# Patient Record
Sex: Female | Born: 1995 | Hispanic: No | Marital: Single | Smoking: Never smoker
Health system: Southern US, Community
[De-identification: ages and names within clinical notes are randomized; demographics above are authoritative.]

## PROBLEM LIST (undated history)

## (undated) ENCOUNTER — Emergency Department (HOSPITAL_COMMUNITY): Admission: EM | Discharge status: Absent without leave | Payer: Managed Care, Other (non HMO)

## (undated) DIAGNOSIS — E282 Polycystic ovarian syndrome: Secondary | ICD-10-CM

## (undated) HISTORY — PX: WISDOM TOOTH EXTRACTION: SHX21

## (undated) HISTORY — DX: Polycystic ovarian syndrome: E28.2

---

## 2016-07-13 ENCOUNTER — Ambulatory Visit: Payer: Self-pay | Admitting: Family

## 2016-08-03 ENCOUNTER — Telehealth: Payer: Self-pay | Admitting: Behavioral Health

## 2016-08-03 NOTE — Telephone Encounter (Signed)
Unable to reach patient at time of Pre-Visit Call.  Left message for patient to return call when available.    

## 2016-08-04 ENCOUNTER — Ambulatory Visit: Payer: Self-pay | Admitting: Family

## 2016-08-04 DIAGNOSIS — Z0289 Encounter for other administrative examinations: Secondary | ICD-10-CM

## 2017-04-18 ENCOUNTER — Encounter: Payer: Self-pay | Admitting: Emergency Medicine

## 2017-04-18 ENCOUNTER — Emergency Department (HOSPITAL_COMMUNITY): Payer: Managed Care, Other (non HMO)

## 2017-04-18 ENCOUNTER — Observation Stay (HOSPITAL_COMMUNITY)
Admission: EM | Admit: 2017-04-18 | Discharge: 2017-04-20 | Disposition: A | Discharge status: Home / Self Care | Payer: Managed Care, Other (non HMO) | Attending: General Surgery | Admitting: General Surgery

## 2017-04-18 DIAGNOSIS — K3580 Unspecified acute appendicitis: Principal | ICD-10-CM | POA: Insufficient documentation

## 2017-04-18 DIAGNOSIS — R1031 Right lower quadrant pain: Secondary | ICD-10-CM | POA: Diagnosis present

## 2017-04-18 LAB — COMPREHENSIVE METABOLIC PANEL
ALK PHOS: 48 U/L (ref 38–126)
ALT: 25 U/L (ref 14–54)
AST: 20 U/L (ref 15–41)
Albumin: 3.6 g/dL (ref 3.5–5.0)
Anion gap: 7 (ref 5–15)
BUN: 7 mg/dL (ref 6–20)
CALCIUM: 8.8 mg/dL — AB (ref 8.9–10.3)
CHLORIDE: 105 mmol/L (ref 101–111)
CO2: 24 mmol/L (ref 22–32)
CREATININE: 0.57 mg/dL (ref 0.44–1.00)
Glucose, Bld: 81 mg/dL (ref 65–99)
Potassium: 3.7 mmol/L (ref 3.5–5.1)
Sodium: 136 mmol/L (ref 135–145)
Total Bilirubin: 0.4 mg/dL (ref 0.3–1.2)
Total Protein: 7.1 g/dL (ref 6.5–8.1)

## 2017-04-18 LAB — CBC
HCT: 37.1 % (ref 36.0–46.0)
Hemoglobin: 12.3 g/dL (ref 12.0–15.0)
MCH: 28 pg (ref 26.0–34.0)
MCHC: 33.2 g/dL (ref 30.0–36.0)
MCV: 84.3 fL (ref 78.0–100.0)
PLATELETS: 348 10*3/uL (ref 150–400)
RBC: 4.4 MIL/uL (ref 3.87–5.11)
RDW: 13 % (ref 11.5–15.5)
WBC: 8.2 10*3/uL (ref 4.0–10.5)

## 2017-04-18 LAB — URINALYSIS, ROUTINE W REFLEX MICROSCOPIC
BILIRUBIN URINE: NEGATIVE
Glucose, UA: NEGATIVE mg/dL
Hgb urine dipstick: NEGATIVE
KETONES UR: NEGATIVE mg/dL
Leukocytes, UA: NEGATIVE
Nitrite: NEGATIVE
PH: 6 (ref 5.0–8.0)
Protein, ur: NEGATIVE mg/dL
SPECIFIC GRAVITY, URINE: 1.025 (ref 1.005–1.030)

## 2017-04-18 LAB — I-STAT BETA HCG BLOOD, ED (MC, WL, AP ONLY)

## 2017-04-18 LAB — LIPASE, BLOOD: LIPASE: 20 U/L (ref 11–51)

## 2017-04-18 MED ORDER — LACTATED RINGERS IV SOLN
INTRAVENOUS | Status: DC
  Administered 2017-04-19 (×3): via INTRAVENOUS

## 2017-04-18 MED ORDER — ONDANSETRON HCL 4 MG/2ML IJ SOLN
4.0000 mg | Freq: Four times a day (QID) | INTRAMUSCULAR | Status: DC | PRN
  Administered 2017-04-19 (×2): 4 mg via INTRAVENOUS
  Filled 2017-04-18: qty 2

## 2017-04-18 MED ORDER — ENOXAPARIN SODIUM 40 MG/0.4ML ~~LOC~~ SOLN
40.0000 mg | Freq: Every day | SUBCUTANEOUS | Status: DC
  Administered 2017-04-19: 40 mg via SUBCUTANEOUS
  Filled 2017-04-18: qty 0.4

## 2017-04-18 MED ORDER — IOPAMIDOL (ISOVUE-300) INJECTION 61%
INTRAVENOUS | Status: AC
  Administered 2017-04-18: 100 mL
  Filled 2017-04-18: qty 100

## 2017-04-18 MED ORDER — NORGESTIMATE-ETH ESTRADIOL 0.25-35 MG-MCG PO TABS
1.0000 | ORAL_TABLET | Freq: Every day | ORAL | Status: DC
  Administered 2017-04-19: 1 via ORAL

## 2017-04-18 MED ORDER — ONDANSETRON 4 MG PO TBDP: 4.0000 mg | ORAL_TABLET | Freq: Four times a day (QID) | ORAL | Status: DC | PRN

## 2017-04-18 MED ORDER — MORPHINE SULFATE (PF) 2 MG/ML IV SOLN: 2.0000 mg | INTRAVENOUS | Status: DC | PRN

## 2017-04-18 NOTE — ED Provider Notes (Signed)
[facility] DEPT Provider Note   CSN: [id number] Arrival date & time: 04/18/17  1451     History   Chief Complaint Chief Complaint  Patient presents with  . Abdominal Pain    HPI Misty Oneill is a 21 y.o. female.chief complaint is abdominal pain  HPI :  This is a 21 year old female with abdominal pain. She waking this morning not feeling well and a poor appetite. She has some nonspecific upper abdominal pain that is now migrated to her right lower quadrant. She is no blanching bleeding or discharge. She is on OCPs. Her last period was over 2 weeks ago. Nausea but no vomiting. No fevers or chills. No vaginal discharge or STD concerns.  History reviewed. No pertinent past medical history.  Patient Active Problem List   Diagnosis Date Noted  . Right lower quadrant abdominal pain 04/18/2017    History reviewed. No pertinent surgical history.  OB History    No data available       Home Medications    Prior to Admission medications   Medication Sig Start Date End Date Taking? Authorizing Provider  norgestimate-ethinyl estradiol (ORTHO-CYCLEN,SPRINTEC,PREVIFEM) 0.25-35 MG-MCG tablet Take 1 tablet by mouth daily.   Yes [provider]    Family History History reviewed. No pertinent family history.  Social History Social History  Substance Use Topics  . Smoking status: Never Smoker  . Smokeless tobacco: Never Used  . Alcohol use No     Allergies   Patient has no known allergies.   Review of Systems Review of Systems  Constitutional: Positive for appetite change. Negative for chills, diaphoresis, fatigue and fever.  HENT: Negative for mouth sores, sore throat and trouble swallowing.   Eyes: Negative for visual disturbance.  Respiratory: Negative for cough, chest tightness, shortness of breath and wheezing.   Cardiovascular: Negative for chest pain.  Gastrointestinal: Positive for abdominal pain and nausea. Negative for abdominal  distention, diarrhea and vomiting.  Endocrine: Negative for polydipsia, polyphagia and polyuria.  Genitourinary: Negative for dysuria, frequency and hematuria.  Musculoskeletal: Negative for gait problem.  Skin: Negative for color change, pallor and rash.  Neurological: Negative for dizziness, syncope, light-headedness and headaches.  Hematological: Does not bruise/bleed easily.  Psychiatric/Behavioral: Negative for behavioral problems and confusion.     Physical Exam Updated Vital Signs BP 132/66 (BP Location: Left Arm)   Pulse 61   Temp 98.4 F (36.9 C) (Oral)   Resp 18   Ht 5\' 7"  (1.702 m)   Wt 102.9 kg (226 lb 13.7 oz)   LMP 04/04/2017 (Approximate)   SpO2 100%   BMI 35.53 kg/m   Physical Exam  Constitutional: She is oriented to person, place, and time. She appears well-developed and well-nourished. No distress.  HENT:  Head: Normocephalic.  Eyes: Pupils are equal, round, and reactive to light. Conjunctivae are normal. No scleral icterus.  Neck: Normal range of motion. Neck supple. No thyromegaly present.  Cardiovascular: Normal rate and regular rhythm.  Exam reveals no gallop and no friction rub.   No murmur heard. Pulmonary/Chest: Effort normal and breath sounds normal. No respiratory distress. She has no wheezes. She has no rales.  Abdominal: Soft. Bowel sounds are normal. She exhibits no distension. There is no tenderness. There is no rebound.  Abdomen is soft. No pain with leg flexion or extension. No pain with cough but tenderness in the right lower quadrant without frank rebound tenderness. No right upper quadrant pain no suprapubic pain  Musculoskeletal: Normal range of  motion.  Neurological: She is alert and oriented to person, place, and time.  Skin: Skin is warm and dry. No rash noted.  Psychiatric: She has a normal mood and affect. Her behavior is normal.     ED Treatments / Results  Labs (all labs ordered are listed, but only abnormal results are  displayed) Labs Reviewed  COMPREHENSIVE METABOLIC PANEL - Abnormal; Notable for the following:       Result Value   Calcium 8.8 (*)    All other components within normal limits  URINALYSIS, ROUTINE W REFLEX MICROSCOPIC - Abnormal; Notable for the following:    Bacteria, UA RARE (*)    Squamous Epithelial / LPF 6-30 (*)    All other components within normal limits  URINE CULTURE  LIPASE, BLOOD  CBC  HIV ANTIBODY (ROUTINE TESTING)  CBC  I-STAT BETA HCG BLOOD, ED ([facility], [facility], [facility] ONLY)    EKG  EKG Interpretation None       Radiology Ct Abdomen Pelvis W Contrast  Result Date: 04/18/2017 CLINICAL DATA:  Abdominal pain with nausea EXAM: CT ABDOMEN AND PELVIS WITH CONTRAST TECHNIQUE: Multidetector CT imaging of the abdomen and pelvis was performed using the standard protocol following bolus administration of intravenous contrast. CONTRAST:  135mL ISOVUE-300 IOPAMIDOL (ISOVUE-300) INJECTION 61% COMPARISON:  None. FINDINGS: Lower chest: No acute abnormality. Hepatobiliary: No focal liver abnormality is seen. No gallstones, gallbladder wall thickening, or biliary dilatation. Pancreas: Unremarkable. No pancreatic ductal dilatation or surrounding inflammatory changes. Spleen: Normal in size without focal abnormality. Adrenals/Urinary Tract: Adrenal glands are unremarkable. Kidneys are normal, without renal calculi, focal lesion, or hydronephrosis. Bladder is unremarkable. Stomach/Bowel: Stomach is within normal limits. Proximal appendix is of normal caliber. Distal appendix and tip borderline to slightly enlarged, measuring up to 8 mm, but not much surrounding inflammation. No evidence of bowel wall thickening, distention, or inflammatory changes. Vascular/Lymphatic: Non aneurysmal aorta. No significantly enlarged lymph nodes Reproductive: Uterus and bilateral adnexa are unremarkable. Other: Small free fluid in the pelvis. Negative for free air. Small fat in the umbilicus. Musculoskeletal: No acute or  significant osseous findings. IMPRESSION: 1. Borderline enlarged distal appendix an appendix tip uptake 8 mm but not much surrounding inflammation, clinical correlation recommended. 2. Small amount of free fluid in the pelvis. There are no other significant abnormalities seen. Electronically Signed   By: Donavan Foil M.D.   On: 04/18/2017 20:00    Procedures Procedures (including critical care time)  Medications Ordered in ED Medications  norgestimate-ethinyl estradiol (ORTHO-CYCLEN,SPRINTEC,PREVIFEM) 0.25-35 MG-MCG tablet 1 tablet (not administered)  enoxaparin (LOVENOX) injection 40 mg (not administered)  lactated ringers infusion (not administered)  morphine 2 MG/ML injection 2-4 mg (not administered)  ondansetron (ZOFRAN-ODT) disintegrating tablet 4 mg (not administered)    Or  ondansetron (ZOFRAN) injection 4 mg (not administered)  iopamidol (ISOVUE-300) 61 % injection (100 mLs  Contrast Given 04/18/17 1917)     Initial Impression / Assessment and Plan / ED Course  I have reviewed the triage vital signs and the nursing notes.  Pertinent labs & imaging results that were available during my care of the patient were reviewed by me and considered in my medical decision making (see chart for details).    Patient's exam is equivocal. Unfortunately so is her CT scan which shows thickening of the distal appendix with no periappendiceal inflammation. She has a leukocytosis. I discussed the case with Dr. Excell Seltzer. Dr. Excell Seltzer immediately return my call and agrees to examine the patient emergency room. He is  in agreement that her exam is suggestive but not definitive. He we'll plan admission for serial exams and appendectomy as indicated.  Final Clinical Impressions(s) / ED Diagnoses   Final diagnoses:  Right lower quadrant abdominal pain    New Prescriptions Current Discharge Medication List       Tanna Furry, MD 04/18/17 2330

## 2017-04-18 NOTE — H&P (Signed)
Misty Oneill is an 21 y.o. female.    Chief Complaint: Abdominal pain  HPI: patient is a 21 year old female who this morning, now about 12 hours ago developed the gradual onset of generalized crampy like abdominal pain. She went to work and while bear the pain moved to the right lower quadrant. The pain gradually became worse where she felt like she had to leave work and went to an urgent care center. She was referred to the emergency department for further evaluation. She describes fairly constant right lower quadrant pain with occasional stabbing. It waxes and wanes. Worse with motion. She has had some lack of appetite and mild nausea without vomiting. Denies fever or chills. No urinary symptoms. No history of any previous similar problems or chronic GI complaints. No vaginal discharge or abnormal bleeding.  History reviewed. No pertinent past medical history.  History reviewed. No pertinent surgical history.  History reviewed. No pertinent family history. Social History:  reports that she has never smoked. She has never used smokeless tobacco. She reports that she does not drink alcohol or use drugs.  Allergies: No Known Allergies  No current facility-administered medications for this encounter.    Current Outpatient Prescriptions  Medication Sig Dispense Refill  . norgestimate-ethinyl estradiol (ORTHO-CYCLEN,SPRINTEC,PREVIFEM) 0.25-35 MG-MCG tablet Take 1 tablet by mouth daily.        Results for orders placed or performed during the hospital encounter of 04/18/17 (from the past 48 hour(s))  Lipase, blood     Status: None   Collection Time: 04/18/17  5:12 PM  Result Value Ref Range   Lipase 20 11 - 51 U/L  Comprehensive metabolic panel     Status: Abnormal   Collection Time: 04/18/17  5:12 PM  Result Value Ref Range   Sodium 136 135 - 145 mmol/L   Potassium 3.7 3.5 - 5.1 mmol/L   Chloride 105 101 - 111 mmol/L   CO2 24 22 - 32 mmol/L   Glucose, Bld 81 65 - 99 mg/dL    BUN 7 6 - 20 mg/dL   Creatinine, Ser 0.57 0.44 - 1.00 mg/dL   Calcium 8.8 (L) 8.9 - 10.3 mg/dL   Total Protein 7.1 6.5 - 8.1 g/dL   Albumin 3.6 3.5 - 5.0 g/dL   AST 20 15 - 41 U/L   ALT 25 14 - 54 U/L   Alkaline Phosphatase 48 38 - 126 U/L   Total Bilirubin 0.4 0.3 - 1.2 mg/dL   GFR calc non Af Amer >60 >60 mL/min   GFR calc Af Amer >60 >60 mL/min    Comment: (NOTE) The eGFR has been calculated using the CKD EPI equation. This calculation has not been validated in all clinical situations. eGFR's persistently <60 mL/min signify possible Chronic Kidney Disease.    Anion gap 7 5 - 15  CBC     Status: None   Collection Time: 04/18/17  5:12 PM  Result Value Ref Range   WBC 8.2 4.0 - 10.5 K/uL   RBC 4.40 3.87 - 5.11 MIL/uL   Hemoglobin 12.3 12.0 - 15.0 g/dL   HCT 37.1 36.0 - 46.0 %   MCV 84.3 78.0 - 100.0 fL   MCH 28.0 26.0 - 34.0 pg   MCHC 33.2 30.0 - 36.0 g/dL   RDW 13.0 11.5 - 15.5 %   Platelets 348 150 - 400 K/uL  I-Stat beta hCG blood, ED     Status: None   Collection Time: 04/18/17  5:20 PM  Result Value Ref  Range   I-stat hCG, quantitative <5.0 <5 mIU/mL   Comment 3            Comment:   GEST. AGE      CONC.  (mIU/mL)   <=1 WEEK        5 - 50     2 WEEKS       50 - 500     3 WEEKS       100 - 10,000     4 WEEKS     1,000 - 30,000        FEMALE AND NON-PREGNANT FEMALE:     LESS THAN 5 mIU/mL   Urinalysis, Routine w reflex microscopic     Status: Abnormal   Collection Time: 04/18/17  5:34 PM  Result Value Ref Range   Color, Urine YELLOW YELLOW   APPearance CLEAR CLEAR   Specific Gravity, Urine 1.025 1.005 - 1.030   pH 6.0 5.0 - 8.0   Glucose, UA NEGATIVE NEGATIVE mg/dL   Hgb urine dipstick NEGATIVE NEGATIVE   Bilirubin Urine NEGATIVE NEGATIVE   Ketones, ur NEGATIVE NEGATIVE mg/dL   Protein, ur NEGATIVE NEGATIVE mg/dL   Nitrite NEGATIVE NEGATIVE   Leukocytes, UA NEGATIVE NEGATIVE   RBC / HPF 0-5 0 - 5 RBC/hpf   WBC, UA 6-30 0 - 5 WBC/hpf   Bacteria, UA RARE  (A) NONE SEEN   Squamous Epithelial / LPF 6-30 (A) NONE SEEN   Mucus PRESENT    Ct Abdomen Pelvis W Contrast  Result Date: 04/18/2017 CLINICAL DATA:  Abdominal pain with nausea EXAM: CT ABDOMEN AND PELVIS WITH CONTRAST TECHNIQUE: Multidetector CT imaging of the abdomen and pelvis was performed using the standard protocol following bolus administration of intravenous contrast. CONTRAST:  175m ISOVUE-300 IOPAMIDOL (ISOVUE-300) INJECTION 61% COMPARISON:  None. FINDINGS: Lower chest: No acute abnormality. Hepatobiliary: No focal liver abnormality is seen. No gallstones, gallbladder wall thickening, or biliary dilatation. Pancreas: Unremarkable. No pancreatic ductal dilatation or surrounding inflammatory changes. Spleen: Normal in size without focal abnormality. Adrenals/Urinary Tract: Adrenal glands are unremarkable. Kidneys are normal, without renal calculi, focal lesion, or hydronephrosis. Bladder is unremarkable. Stomach/Bowel: Stomach is within normal limits. Proximal appendix is of normal caliber. Distal appendix and tip borderline to slightly enlarged, measuring up to 8 mm, but not much surrounding inflammation. No evidence of bowel wall thickening, distention, or inflammatory changes. Vascular/Lymphatic: Non aneurysmal aorta. No significantly enlarged lymph nodes Reproductive: Uterus and bilateral adnexa are unremarkable. Other: Small free fluid in the pelvis. Negative for free air. Small fat in the umbilicus. Musculoskeletal: No acute or significant osseous findings. IMPRESSION: 1. Borderline enlarged distal appendix an appendix tip uptake 8 mm but not much surrounding inflammation, clinical correlation recommended. 2. Small amount of free fluid in the pelvis. There are no other significant abnormalities seen. Electronically Signed   By: KDonavan FoilM.D.   On: 04/18/2017 20:00    Review of Systems  Constitutional: Negative for chills and fever.  Respiratory: Negative.   Cardiovascular: Negative.    Gastrointestinal: Positive for abdominal pain and nausea. Negative for blood in stool, constipation, diarrhea and vomiting.  Genitourinary: Negative.   Psychiatric/Behavioral: Suicidal ideas: .cmed.    Blood pressure 138/82, pulse 65, temperature 98 F (36.7 C), temperature source Oral, resp. rate 14, last menstrual period 04/04/2017, SpO2 100 %. Physical Exam  General: Alert, moderately obese Hispanic female, in no distress Skin: Warm and dry without rash or infection. HEENT: No palpable masses or thyromegaly. Sclera nonicteric. Pupils  equal round and reactive. Lymph nodes: No cervical, supraclavicular, nodes palpable. Lungs: Breath sounds clear and equal without increased work of breathing Cardiovascular: Regular rate and rhythm without murmur. No JVD or edema. Peripheral pulses intact. Abdomen: Nondistended. There is localized mild right lower quadrant tenderness without guarding or peritoneal signs. No masses palpable. No organomegaly. No palpable hernias. Extremities: No edema or joint swelling or deformity. No chronic venous stasis changes. Neurologic: Alert and fully oriented. Affect normal. No gross motor deficits.  Assessment/Plan Generally healthy 21 year old female with abdominal pain and nausea, history highly suggestive of appendicitis. She is only mildly tender. No leukocytosis and CT scan is equivocal. Possible early appendicitis. I recommended overnight observation and repeat CBC in the morning. If her pain and tenderness persists she likely will require appendectomy. 6-30 WBC noted in UA but nitrite negative. No symptoms. We will obtain urine C&S.  Edward Jolly, MD 04/18/2017, 9:41 PM

## 2017-04-18 NOTE — ED Notes (Signed)
ED Provider at bedside. 

## 2017-04-18 NOTE — ED Triage Notes (Signed)
Pt c/o abdominal pain and nausea since this morning. Pt states pain began from RUQ to LUQ then moved to RLQ later in the morning.

## 2017-04-18 NOTE — Progress Notes (Signed)
Patient arrived on unit.  Ambulated from stretcher to bed independently. 22 L hand- SL. Informed patient of possible OR tomorrow and orders to be NPO after midnight.  Patient verbalized understanding.

## 2017-04-18 NOTE — ED Notes (Addendum)
LMP: week of aug 6

## 2017-04-18 NOTE — ED Notes (Signed)
Surgery at bedside.

## 2017-04-18 NOTE — ED Notes (Signed)
Patient transported to CT 

## 2017-04-19 ENCOUNTER — Encounter (HOSPITAL_COMMUNITY)
Admission: EM | Disposition: A | Discharge status: Home / Self Care | Payer: Self-pay | Source: Home / Self Care | Attending: Emergency Medicine

## 2017-04-19 ENCOUNTER — Observation Stay (HOSPITAL_COMMUNITY): Payer: Managed Care, Other (non HMO) | Admitting: Anesthesiology

## 2017-04-19 ENCOUNTER — Encounter (HOSPITAL_COMMUNITY): Payer: Self-pay

## 2017-04-19 DIAGNOSIS — K3580 Unspecified acute appendicitis: Secondary | ICD-10-CM | POA: Diagnosis not present

## 2017-04-19 HISTORY — PX: LAPAROSCOPIC APPENDECTOMY: SHX408

## 2017-04-19 LAB — CBC
HEMATOCRIT: 34.1 % — AB (ref 36.0–46.0)
Hemoglobin: 11.3 g/dL — ABNORMAL LOW (ref 12.0–15.0)
MCH: 27.9 pg (ref 26.0–34.0)
MCHC: 33.1 g/dL (ref 30.0–36.0)
MCV: 84.2 fL (ref 78.0–100.0)
PLATELETS: 299 10*3/uL (ref 150–400)
RBC: 4.05 MIL/uL (ref 3.87–5.11)
RDW: 13 % (ref 11.5–15.5)
WBC: 7 10*3/uL (ref 4.0–10.5)

## 2017-04-19 LAB — SURGICAL PCR SCREEN
MRSA, PCR: NEGATIVE
STAPHYLOCOCCUS AUREUS: NEGATIVE

## 2017-04-19 SURGERY — APPENDECTOMY, LAPAROSCOPIC
Anesthesia: General

## 2017-04-19 MED ORDER — MIDAZOLAM HCL 2 MG/2ML IJ SOLN
INTRAMUSCULAR | Status: AC
  Filled 2017-04-19: qty 2

## 2017-04-19 MED ORDER — ONDANSETRON HCL 4 MG/2ML IJ SOLN
INTRAMUSCULAR | Status: AC
  Filled 2017-04-19: qty 2

## 2017-04-19 MED ORDER — MIDAZOLAM HCL 5 MG/5ML IJ SOLN
INTRAMUSCULAR | Status: DC | PRN
  Administered 2017-04-19: 2 mg via INTRAVENOUS

## 2017-04-19 MED ORDER — FENTANYL CITRATE (PF) 100 MCG/2ML IJ SOLN
INTRAMUSCULAR | Status: DC | PRN
Start: 2017-04-19 — End: 2017-04-19
  Administered 2017-04-19: 50 ug via INTRAVENOUS
  Administered 2017-04-19: 100 ug via INTRAVENOUS
  Administered 2017-04-19 (×2): 50 ug via INTRAVENOUS

## 2017-04-19 MED ORDER — PROPOFOL 10 MG/ML IV BOLUS
INTRAVENOUS | Status: DC | PRN
  Administered 2017-04-19: 200 mg via INTRAVENOUS

## 2017-04-19 MED ORDER — PROMETHAZINE HCL 25 MG/ML IJ SOLN: 6.2500 mg | INTRAMUSCULAR | Status: DC | PRN

## 2017-04-19 MED ORDER — DEXAMETHASONE SODIUM PHOSPHATE 10 MG/ML IJ SOLN
INTRAMUSCULAR | Status: DC | PRN
  Administered 2017-04-19: 10 mg via INTRAVENOUS

## 2017-04-19 MED ORDER — LIDOCAINE 2% (20 MG/ML) 5 ML SYRINGE
INTRAMUSCULAR | Status: DC | PRN
  Administered 2017-04-19: 60 mg via INTRAVENOUS

## 2017-04-19 MED ORDER — SODIUM CHLORIDE 0.9 % IR SOLN
Status: DC | PRN
  Administered 2017-04-19: 1000 mL

## 2017-04-19 MED ORDER — DEXTROSE 5 % IV SOLN
500.0000 mg | Freq: Four times a day (QID) | INTRAVENOUS | Status: DC
  Administered 2017-04-19 – 2017-04-20 (×4): 500 mg via INTRAVENOUS
  Filled 2017-04-19: qty 550
  Filled 2017-04-19 (×2): qty 5
  Filled 2017-04-19: qty 550
  Filled 2017-04-19: qty 5
  Filled 2017-04-19: qty 550
  Filled 2017-04-19 (×5): qty 5

## 2017-04-19 MED ORDER — OXYCODONE HCL 5 MG PO TABS: 5.0000 mg | ORAL_TABLET | ORAL | Status: DC | PRN

## 2017-04-19 MED ORDER — DEXTROSE 5 % IV SOLN
2.0000 g | Freq: Once | INTRAVENOUS | Status: AC
  Administered 2017-04-19: 2 g via INTRAVENOUS
  Filled 2017-04-19: qty 2

## 2017-04-19 MED ORDER — HYDROMORPHONE HCL-NACL 0.5-0.9 MG/ML-% IV SOSY
0.2500 mg | PREFILLED_SYRINGE | INTRAVENOUS | Status: DC | PRN
  Administered 2017-04-19 (×3): 0.5 mg via INTRAVENOUS

## 2017-04-19 MED ORDER — PROPOFOL 10 MG/ML IV BOLUS
INTRAVENOUS | Status: AC
  Filled 2017-04-19: qty 20

## 2017-04-19 MED ORDER — MORPHINE SULFATE (PF) 2 MG/ML IV SOLN
2.0000 mg | INTRAVENOUS | Status: DC | PRN
  Administered 2017-04-19 (×3): 2 mg via INTRAVENOUS
  Filled 2017-04-19 (×3): qty 1

## 2017-04-19 MED ORDER — METRONIDAZOLE IN NACL 5-0.79 MG/ML-% IV SOLN
500.0000 mg | Freq: Once | INTRAVENOUS | Status: AC
  Administered 2017-04-19: 500 mg via INTRAVENOUS
  Filled 2017-04-19: qty 100

## 2017-04-19 MED ORDER — LIP MEDEX EX OINT
TOPICAL_OINTMENT | CUTANEOUS | Status: AC
  Filled 2017-04-19: qty 7

## 2017-04-19 MED ORDER — HYDROMORPHONE HCL-NACL 0.5-0.9 MG/ML-% IV SOSY
PREFILLED_SYRINGE | INTRAVENOUS | Status: AC
  Administered 2017-04-19: 0.5 mg via INTRAVENOUS
  Filled 2017-04-19: qty 4

## 2017-04-19 MED ORDER — SUGAMMADEX SODIUM 200 MG/2ML IV SOLN
INTRAVENOUS | Status: DC | PRN
  Administered 2017-04-19: 200 mg via INTRAVENOUS

## 2017-04-19 MED ORDER — FENTANYL CITRATE (PF) 250 MCG/5ML IJ SOLN
INTRAMUSCULAR | Status: AC
  Filled 2017-04-19: qty 5

## 2017-04-19 MED ORDER — BUPIVACAINE-EPINEPHRINE (PF) 0.5% -1:200000 IJ SOLN
INTRAMUSCULAR | Status: AC
  Filled 2017-04-19: qty 30

## 2017-04-19 MED ORDER — ROCURONIUM BROMIDE 10 MG/ML (PF) SYRINGE
PREFILLED_SYRINGE | INTRAVENOUS | Status: DC | PRN
  Administered 2017-04-19: 50 mg via INTRAVENOUS

## 2017-04-19 MED ORDER — BUPIVACAINE HCL (PF) 0.5 % IJ SOLN
INTRAMUSCULAR | Status: DC | PRN
  Administered 2017-04-19: 15 mL

## 2017-04-19 SURGICAL SUPPLY — 42 items
APPLIER CLIP 5 13 M/L LIGAMAX5 (MISCELLANEOUS) ×3
APPLIER CLIP ROT 10 11.4 M/L (STAPLE)
BENZOIN TINCTURE PRP APPL 2/3 (GAUZE/BANDAGES/DRESSINGS) ×3 IMPLANT
CABLE HIGH FREQUENCY MONO STRZ (ELECTRODE) ×3 IMPLANT
CHLORAPREP W/TINT 26ML (MISCELLANEOUS) ×3 IMPLANT
CLIP APPLIE 5 13 M/L LIGAMAX5 (MISCELLANEOUS) ×1 IMPLANT
CLIP APPLIE ROT 10 11.4 M/L (STAPLE) IMPLANT
CLOSURE WOUND 1/2 X4 (GAUZE/BANDAGES/DRESSINGS) ×1
CUTTER FLEX LINEAR 45M (STAPLE) ×3 IMPLANT
DECANTER SPIKE VIAL GLASS SM (MISCELLANEOUS) IMPLANT
DRAIN CHANNEL 19F RND (DRAIN) IMPLANT
DRAPE LAPAROSCOPIC ABDOMINAL (DRAPES) ×3 IMPLANT
DRSG TEGADERM 2-3/8X2-3/4 SM (GAUZE/BANDAGES/DRESSINGS) ×9 IMPLANT
ELECT REM PT RETURN 15FT ADLT (MISCELLANEOUS) ×3 IMPLANT
ENDOLOOP SUT PDS II  0 18 (SUTURE)
ENDOLOOP SUT PDS II 0 18 (SUTURE) IMPLANT
EVACUATOR SILICONE 100CC (DRAIN) IMPLANT
GAUZE SPONGE 2X2 8PLY STRL LF (GAUZE/BANDAGES/DRESSINGS) ×1 IMPLANT
GLOVE ECLIPSE 8.0 STRL XLNG CF (GLOVE) ×12 IMPLANT
GLOVE INDICATOR 8.0 STRL GRN (GLOVE) ×3 IMPLANT
GOWN STRL REUS W/TWL XL LVL3 (GOWN DISPOSABLE) ×6 IMPLANT
HEMOSTAT SNOW SURGICEL 2X4 (HEMOSTASIS) ×3 IMPLANT
KIT BASIN OR (CUSTOM PROCEDURE TRAY) ×3 IMPLANT
POUCH SPECIMEN RETRIEVAL 10MM (ENDOMECHANICALS) ×3 IMPLANT
RELOAD 45 VASCULAR/THIN (ENDOMECHANICALS) IMPLANT
RELOAD STAPLE TA45 3.5 REG BLU (ENDOMECHANICALS) ×3 IMPLANT
SCISSORS LAP 5X35 DISP (ENDOMECHANICALS) ×3 IMPLANT
SET IRRIG TUBING LAPAROSCOPIC (IRRIGATION / IRRIGATOR) ×3 IMPLANT
SHEARS HARMONIC ACE PLUS 36CM (ENDOMECHANICALS) ×3 IMPLANT
SLEEVE XCEL OPT CAN 5 100 (ENDOMECHANICALS) ×3 IMPLANT
SPONGE GAUZE 2X2 STER 10/PKG (GAUZE/BANDAGES/DRESSINGS) ×2
STRIP CLOSURE SKIN 1/2X4 (GAUZE/BANDAGES/DRESSINGS) ×2 IMPLANT
SUT ETHILON 3 0 PS 1 (SUTURE) IMPLANT
SUT MNCRL AB 4-0 PS2 18 (SUTURE) ×3 IMPLANT
TOWEL OR 17X26 10 PK STRL BLUE (TOWEL DISPOSABLE) ×3 IMPLANT
TOWEL OR NON WOVEN STRL DISP B (DISPOSABLE) ×3 IMPLANT
TRAY FOLEY W/METER SILVER 14FR (SET/KITS/TRAYS/PACK) IMPLANT
TRAY FOLEY W/METER SILVER 16FR (SET/KITS/TRAYS/PACK) ×3 IMPLANT
TRAY LAPAROSCOPIC (CUSTOM PROCEDURE TRAY) ×3 IMPLANT
TROCAR BLADELESS OPT 5 100 (ENDOMECHANICALS) ×3 IMPLANT
TROCAR XCEL BLUNT TIP 100MML (ENDOMECHANICALS) ×3 IMPLANT
TUBING INSUF HEATED (TUBING) ×3 IMPLANT

## 2017-04-19 NOTE — Anesthesia Preprocedure Evaluation (Signed)
Anesthesia Evaluation  Patient identified by MRN, date of birth, ID band Patient awake    Reviewed: Allergy & Precautions, NPO status , Patient's Chart, lab work & pertinent test results  Airway Mallampati: II   Neck ROM: Full    Dental no notable dental hx. (+) Teeth Intact   Pulmonary neg pulmonary ROS,    breath sounds clear to auscultation       Cardiovascular negative cardio ROS   Rhythm:Regular Rate:Normal     Neuro/Psych negative neurological ROS  negative psych ROS   GI/Hepatic Neg liver ROS, appendcitis   Endo/Other  negative endocrine ROS  Renal/GU negative Renal ROS  negative genitourinary   Musculoskeletal negative musculoskeletal ROS (+)   Abdominal   Peds negative pediatric ROS (+)  Hematology negative hematology ROS (+)   Anesthesia Other Findings   Reproductive/Obstetrics negative OB ROS                             Anesthesia Physical Anesthesia Plan  ASA: II and emergent  Anesthesia Plan: General   Post-op Pain Management:    Induction: Intravenous  PONV Risk Score and Plan: 4 or greater and Ondansetron, Dexamethasone, Midazolam, Scopolamine patch - Pre-op and Treatment may vary due to age or medical condition  Airway Management Planned: Oral ETT  Additional Equipment:   Intra-op Plan:   Post-operative Plan: Extubation in OR  Informed Consent: I have reviewed the patients History and Physical, chart, labs and discussed the procedure including the risks, benefits and alternatives for the proposed anesthesia with the patient or authorized representative who has indicated his/her understanding and acceptance.   Dental advisory given  Plan Discussed with: CRNA  Anesthesia Plan Comments:         Anesthesia Quick Evaluation

## 2017-04-19 NOTE — Op Note (Signed)
Appendectomy, Lap, Procedure Note  Pre-operative Diagnosis: Acute appendicitis  Post-operative Diagnosis: Same  Procedure:  Laparoscopic appendectomy  Surgeon:  Jackolyn Confer, M.D.  Anesthesia:  General   Indications:  This is a 21 year old female with RLQ pain, tenderness, and a CT suggesting early inflammatory changes of the appendix consistent with early acute appendicitis.  She now presents for appendectomy.   Procedure Details   She was brought to the operating room, placed in the supine position and general anesthesia was induced, along with placement of orogastric tube, SCDs, and a Foley catheter. A timeout was performed. The abdomen was prepped and draped in a sterile fashion. A small infraumbilical incision was made through the skin, subcutaneous tissue, fascia, and peritoneum entering the peritoneal cavity under direct vision.  A pursestring suture was passed around the fascia with a 0 Vicryl.  The Hasson was introduced into the peritoneal cavity and the tails of the suture were used to hold the Hasson in place.   The pneumoperitoneum was then established to steady pressure of 15 mmHg.   The laparoscope was introduced and there was no evidence of bleeding or underlying organ injury. Additional 5 mm cannulas then placed in the left lower quadrant of the abdomen and the right upper quadrant region under direct visualization. A careful evaluation of the entire abdomen was carried out. The patient was placed in Trendelenburg and left lateral decubitus position. The small intestines were retracted in the cephalad and left lateral direction away from the pelvis and right lower quadrant. The patient was found to have an slightly enlarged appendix with mild inflammatory changes in the right lower quadrant.  There were some free fluid in the pelvis that was not purulent.  There was no evidence of perforation.  The appendix was carefully mobilized. The mesoappendix was was divided with the  harmonic scalpel.   The appendix was amputated off the cecum, with a small cuff of cecum, using an endo-GIA stapler.  The appendix was placed in a retrieval bag and removed through the subumbilical port incision.   There was some bleeding from the staple line controlled with clips. Copious irrigation was  performed and irrigant fluid suctioned from the abdomen as much as possible. Inspection of the abdominal cavity demonstrated no evidence of bleeding, organ injury or staple line leak.  The umbilical trocar was removed and the  port site fascia was closed via the purse string suture under laparoscopic vision. There was no residual palpable fascial defect.  The remaining trocars were removed and all  trocar site skin wounds were closed with 4-0 Monocryl. Steri strips and sterile dressings were applied.  Instrument, sponge, and needle counts were correct at the conclusion of the case.   Findings: The appendix was found to be inflamed. There were not signs of necrosis.  There was not perforation. There was not abscess formation.  Estimated Blood Loss:  100 ml         Drains: none         Specimens: appendix         Complications:  None; patient tolerated the procedure well.         Disposition: PACU - hemodynamically stable.         Condition: stable

## 2017-04-19 NOTE — Transfer of Care (Signed)
Immediate Anesthesia Transfer of Care Note  Patient: Misty Oneill  Procedure(s) Performed: Procedure(s): APPENDECTOMY LAPAROSCOPIC (N/A)  Patient Location: PACU  Anesthesia Type:General  Level of Consciousness: awake, alert  and oriented  Airway & Oxygen Therapy: Patient Spontanous Breathing and Patient connected to face mask oxygen  Post-op Assessment: Report given to RN and Post -op Vital signs reviewed and stable  Post vital signs: Reviewed and stable  Last Vitals:  Vitals:   04/19/17 0226 04/19/17 0540  BP: 120/70 (!) 119/54  Pulse: (!) 57 (!) 58  Resp: 16 16  Temp: 36.6 C 36.8 C  SpO2: 99% 100%    Last Pain:  Vitals:   04/19/17 1016  TempSrc:   PainSc: 0-No pain      Patients Stated Pain Goal: 4 (36/68/15 9470)  Complications: No apparent anesthesia complications

## 2017-04-19 NOTE — Progress Notes (Signed)
Assessment Active Problems:   Right lower quadrant abdominal pain-Hx, PE, CT all concerning for early acute appendicitis; she is not better clinically   Plan:  Laparoscopic appendectomy.  I have discussed the procedure, risks, and aftercare of appendectomy. The risks include but are not limited to bleeding, infection, wound problems, anesthesia, injury to intra-abdominal organs, possibility of postoperative ileus. She seems to understand and agrees with the plan.   LOS: 0 days        Chief Complaint/Subjective: Pain is the same and increases when she moves.  Objective: Vital signs in last 24 hours: Temp:  [97.9 F (36.6 C)-98.4 F (36.9 C)] 98.3 F (36.8 C) (08/27 0540) Pulse Rate:  [57-74] 58 (08/27 0540) Resp:  [14-20] 16 (08/27 0540) BP: (119-152)/(54-104) 119/54 (08/27 0540) SpO2:  [99 %-100 %] 100 % (08/27 0540) Weight:  [102.9 kg (226 lb 13.7 oz)] 102.9 kg (226 lb 13.7 oz) (08/26 2322)    Intake/Output from previous day: 08/26 0701 - 08/27 0700 In: 573.3 [I.V.:573.3] Out: -  Intake/Output this shift: No intake/output data recorded.  PE: General- In NAD.  Awake and alert. Abdomen-soft, RLQ tenderness with no guarding or mass  Lab Results:   Recent Labs  04/18/17 1712 04/19/17 0402  WBC 8.2 7.0  HGB 12.3 11.3*  HCT 37.1 34.1*  PLT 348 299   BMET  Recent Labs  04/18/17 1712  NA 136  K 3.7  CL 105  CO2 24  GLUCOSE 81  BUN 7  CREATININE 0.57  CALCIUM 8.8*   PT/INR No results for input(s): LABPROT, INR in the last 72 hours. Comprehensive Metabolic Panel:    Component Value Date/Time   NA 136 04/18/2017 1712   K 3.7 04/18/2017 1712   CL 105 04/18/2017 1712   CO2 24 04/18/2017 1712   BUN 7 04/18/2017 1712   CREATININE 0.57 04/18/2017 1712   GLUCOSE 81 04/18/2017 1712   CALCIUM 8.8 (L) 04/18/2017 1712   AST 20 04/18/2017 1712   ALT 25 04/18/2017 1712   ALKPHOS 48 04/18/2017 1712   BILITOT 0.4 04/18/2017 1712   PROT 7.1 04/18/2017 1712   ALBUMIN 3.6 04/18/2017 1712     Studies/Results: Ct Abdomen Pelvis W Contrast  Result Date: 04/18/2017 CLINICAL DATA:  Abdominal pain with nausea EXAM: CT ABDOMEN AND PELVIS WITH CONTRAST TECHNIQUE: Multidetector CT imaging of the abdomen and pelvis was performed using the standard protocol following bolus administration of intravenous contrast. CONTRAST:  176mL ISOVUE-300 IOPAMIDOL (ISOVUE-300) INJECTION 61% COMPARISON:  None. FINDINGS: Lower chest: No acute abnormality. Hepatobiliary: No focal liver abnormality is seen. No gallstones, gallbladder wall thickening, or biliary dilatation. Pancreas: Unremarkable. No pancreatic ductal dilatation or surrounding inflammatory changes. Spleen: Normal in size without focal abnormality. Adrenals/Urinary Tract: Adrenal glands are unremarkable. Kidneys are normal, without renal calculi, focal lesion, or hydronephrosis. Bladder is unremarkable. Stomach/Bowel: Stomach is within normal limits. Proximal appendix is of normal caliber. Distal appendix and tip borderline to slightly enlarged, measuring up to 8 mm, but not much surrounding inflammation. No evidence of bowel wall thickening, distention, or inflammatory changes. Vascular/Lymphatic: Non aneurysmal aorta. No significantly enlarged lymph nodes Reproductive: Uterus and bilateral adnexa are unremarkable. Other: Small free fluid in the pelvis. Negative for free air. Small fat in the umbilicus. Musculoskeletal: No acute or significant osseous findings. IMPRESSION: 1. Borderline enlarged distal appendix an appendix tip uptake 8 mm but not much surrounding inflammation, clinical correlation recommended. 2. Small amount of free fluid in the pelvis. There are no other significant  abnormalities seen. Electronically Signed   By: Donavan Foil M.D.   On: 04/18/2017 20:00    Anti-infectives: Anti-infectives    None       Kentarius Partington J 04/19/2017

## 2017-04-19 NOTE — Anesthesia Procedure Notes (Signed)

## 2017-04-19 NOTE — Discharge Instructions (Signed)
[facility] ______[facility] LAPAROSCOPIC APPENDECTOMY SURGERY: POST OP INSTRUCTIONS Always review your discharge instruction sheet given to you by the facility where your surgery was performed. IF YOU HAVE DISABILITY OR FAMILY LEAVE FORMS, YOU MUST BRING THEM TO THE OFFICE FOR PROCESSING.   DO NOT GIVE THEM TO YOUR DOCTOR.  1. A prescription for pain medication may be given to you upon discharge.  Take your pain medication as prescribed, if needed.  If narcotic pain medicine is not needed, then you may take acetaminophen (Tylenol) or ibuprofen (Advil) as needed. 2. Take your usually prescribed medications unless otherwise directed. 3. If you need a refill on your pain medication, please contact your pharmacy.  They will contact our office to request authorization. Prescriptions will not be filled after 5pm or on week-ends. 4. You should follow a light diet the first few days after arrival home, such as soup and crackers, etc.  Be sure to include lots of fluids daily.  May start lowfat, solid foods 2 days after the surgery. 5. Most patients will experience some swelling and bruising in the area of the incisions.  Ice packs will help.  Swelling and bruising can take several days to resolve.  6. It is common to experience some constipation if taking pain medication after surgery.  Increasing fluid intake and taking a stool softener (such as Colace) will usually help or prevent this problem from occurring.  A mild laxative (Milk of Magnesia or Miralax) should be taken according to package instructions if there are no bowel movements after 48 hours. 7. Unless discharge instructions indicate otherwise, you may remove your bandages on 3 days after surgery.  You may shower when you get home.  You may have steri-strips (small skin tapes) in place directly over the incision.  These strips should be left on the skin until they fall off.  If your surgeon used skin glue on the incision, you may shower in  24 hours.  The glue will flake off over the next 2-3 weeks.  Any sutures or staples will be removed at the office during your follow-up visit. 8. ACTIVITIES:  You may resume regular (light) daily activities beginning the next day--such as daily self-care, walking, climbing stairs--gradually increasing activities as tolerated.  You may have sexual intercourse when it is comfortable.  Refrain from any heavy lifting or straining for two weeks.  Do not lift anything over 10 pounds during that time.  a. You may drive when you are no longer taking prescription pain medication, you can comfortably wear a seatbelt, and you can safely maneuver your car and apply brakes. b. RETURN TO WORK:  Desk type work in 1 week, full duty work in 2 weeks if you are pain-free.________________________________________________________ 9. You should see your doctor or a PA in the office for a follow-up appointment approximately 2-3 weeks after your surgery.  Make sure that you call for this appointment within a day or two after you arrive home to insure a convenient appointment time. 10. OTHER INSTRUCTIONS: __________________________________________________________________________________________________________________________ __________________________________________________________________________________________________________________________ WHEN TO CALL YOUR DOCTOR: 1. Fever over 101.0 2. Inability to urinate 3. Continued bleeding from incision. 4. Increased pain, redness, or drainage from the incision. 5. Increasing abdominal pain  The clinic staff is available to answer your questions during regular business hours.  Please dont hesitate to call and ask to speak to one of the nurses for clinical concerns.  If you have a medical emergency, go to the nearest emergency room or call 911.  A surgeon from [facility] is always on call at the hospital. [address], [place], [place], [place]  [address] ?  [address], [place], [place]   [address] [phone] ? [phone] ? FAX [phone] Web site: [email or web address]

## 2017-04-20 DIAGNOSIS — K3580 Unspecified acute appendicitis: Secondary | ICD-10-CM | POA: Diagnosis not present

## 2017-04-20 LAB — HIV ANTIBODY (ROUTINE TESTING W REFLEX): HIV Screen 4th Generation wRfx: NONREACTIVE

## 2017-04-20 MED ORDER — ACETAMINOPHEN 325 MG PO TABS
650.0000 mg | ORAL_TABLET | Freq: Four times a day (QID) | ORAL | Status: DC
  Administered 2017-04-20: 650 mg via ORAL
  Filled 2017-04-20: qty 2

## 2017-04-20 MED ORDER — ENOXAPARIN SODIUM 40 MG/0.4ML ~~LOC~~ SOLN
40.0000 mg | SUBCUTANEOUS | Status: DC
  Administered 2017-04-20: 40 mg via SUBCUTANEOUS
  Filled 2017-04-20: qty 0.4

## 2017-04-20 MED ORDER — MORPHINE SULFATE (PF) 2 MG/ML IV SOLN: 2.0000 mg | INTRAVENOUS | Status: DC | PRN

## 2017-04-20 MED ORDER — IBUPROFEN 200 MG PO TABS
400.0000 mg | ORAL_TABLET | Freq: Four times a day (QID) | ORAL | Status: DC
  Administered 2017-04-20: 400 mg via ORAL
  Filled 2017-04-20: qty 2

## 2017-04-20 MED ORDER — METHOCARBAMOL 500 MG PO TABS: 500.0000 mg | ORAL_TABLET | Freq: Three times a day (TID) | ORAL | Status: DC | PRN

## 2017-04-20 MED ORDER — SIMETHICONE 80 MG PO CHEW
80.0000 mg | CHEWABLE_TABLET | Freq: Four times a day (QID) | ORAL | Status: DC | PRN
  Administered 2017-04-20: 80 mg via ORAL
  Filled 2017-04-20: qty 1

## 2017-04-20 MED ORDER — OXYCODONE HCL 5 MG PO TABS: 5.0000 mg | ORAL_TABLET | ORAL | Status: DC | PRN

## 2017-04-20 MED ORDER — OXYCODONE HCL 5 MG PO TABS: 5.0000 mg | ORAL_TABLET | ORAL | 0 refills | Status: DC | PRN

## 2017-04-20 NOTE — Progress Notes (Signed)
Discussed with patient discharge instructions, she verbalized agreement and understanding.  Patient to go with all belongings to go home in private vehicle.

## 2017-04-20 NOTE — Discharge Summary (Signed)
[facility] Discharge Summary   Patient ID: Misty Oneill MRN: [id number] DOB/AGE: 22/29/1997 22 y.o.  Admit date: 04/18/2017 Discharge date: 04/20/2017  Admitting Diagnosis: RLQ pain  Discharge Diagnosis Patient Active Problem List   Diagnosis Date Noted  . Right lower quadrant abdominal pain 04/18/2017    Consultants None  Imaging: Ct Abdomen Pelvis W Contrast  Result Date: 04/18/2017 CLINICAL DATA:  Abdominal pain with nausea EXAM: CT ABDOMEN AND PELVIS WITH CONTRAST TECHNIQUE: Multidetector CT imaging of the abdomen and pelvis was performed using the standard protocol following bolus administration of intravenous contrast. CONTRAST:  14mL ISOVUE-300 IOPAMIDOL (ISOVUE-300) INJECTION 61% COMPARISON:  None. FINDINGS: Lower chest: No acute abnormality. Hepatobiliary: No focal liver abnormality is seen. No gallstones, gallbladder wall thickening, or biliary dilatation. Pancreas: Unremarkable. No pancreatic ductal dilatation or surrounding inflammatory changes. Spleen: Normal in size without focal abnormality. Adrenals/Urinary Tract: Adrenal glands are unremarkable. Kidneys are normal, without renal calculi, focal lesion, or hydronephrosis. Bladder is unremarkable. Stomach/Bowel: Stomach is within normal limits. Proximal appendix is of normal caliber. Distal appendix and tip borderline to slightly enlarged, measuring up to 8 mm, but not much surrounding inflammation. No evidence of bowel wall thickening, distention, or inflammatory changes. Vascular/Lymphatic: Non aneurysmal aorta. No significantly enlarged lymph nodes Reproductive: Uterus and bilateral adnexa are unremarkable. Other: Small free fluid in the pelvis. Negative for free air. Small fat in the umbilicus. Musculoskeletal: No acute or significant osseous findings. IMPRESSION: 1. Borderline enlarged distal appendix an appendix tip uptake 8 mm but not much surrounding inflammation, clinical correlation recommended.  2. Small amount of free fluid in the pelvis. There are no other significant abnormalities seen. Electronically Signed   By: Donavan Foil M.D.   On: 04/18/2017 20:00    Procedures Dr. Zella Richer (04/19/17) - Laparoscopic Appendectomy  Hospital Course:  Misty Oneill is a 21yo female who presented to [facility] 8/26 with 12 hours of crampy abdominal pain that localized to the RLQ.  Workup showed no leukocytosis and an equivocal CT scan.  Patient was admitted for overnight observation.  Pain and tenderness persisted therefore patient was taken for a laparoscopic appendectomy. Tolerated procedure well and was transferred to the floor.  Diet was advanced as tolerated.  On POD1 the patient was voiding well, tolerating diet, ambulating well, pain well controlled, vital signs stable, incisions c/d/i and felt stable for discharge home.  Patient will follow up in our office in 2 weeks and knows to call with questions or concerns.  I have personally reviewed the patients medication history on the [place] controlled substance database.    Physical Exam: Gen:  Alert, NAD, pleasant HEENT: EOM's intact, pupils equal and round Card:  RRR, no M/G/R heard Pulm:  effort normal Abd: Soft, ND, appropriately tender, +BS, multiple lap incisions with clean/dry dressings in place Skin: no rashes noted, warm and dry   Allergies as of 04/20/2017   No Known Allergies     Medication List    TAKE these medications   norgestimate-ethinyl estradiol 0.25-35 MG-MCG tablet Commonly known as:  ORTHO-CYCLEN,SPRINTEC,PREVIFEM Take 1 tablet by mouth daily.   oxyCODONE 5 MG immediate release tablet Commonly known as:  Oxy IR/ROXICODONE Take 1 tablet (5 mg total) by mouth every 4 (four) hours as needed for moderate pain or severe pain.            Discharge Care Instructions        Start     Ordered   04/20/17 0000  oxyCODONE (OXY IR/ROXICODONE) 5 MG  immediate release tablet  Every 4 hours PRN     04/20/17 0749    04/20/17 0000  Discharge patient    Question Answer Comment  Discharge disposition 01-Home or Self Care   Discharge patient date 04/20/2017      04/20/17 1431       Follow-up [facility] Go on 05/04/2017.   Specialty:  General Surgery Why:  Your appointment is 05/04/17 at 10:15 am. Please arrive 30 minutes prior to your appointment to check in and fill out necessary paperwork. Contact information: [address] [place] [phone]          Signed: Wellington Hampshire, [facility] 04/20/2017, 2:31 PM Pager: [phone] Consults: [phone] Mon-Fri 7:00 am-4:30 pm Sat-Sun 7:00 am-11:30 am

## 2017-04-20 NOTE — Anesthesia Postprocedure Evaluation (Signed)
Anesthesia Post Note  Patient: Misty Oneill  Procedure(s) Performed: Procedure(s) (LRB): APPENDECTOMY LAPAROSCOPIC (N/A)     Patient location during evaluation: PACU Anesthesia Type: General Level of consciousness: awake and alert Pain management: pain level controlled Vital Signs Assessment: post-procedure vital signs reviewed and stable Respiratory status: spontaneous breathing, nonlabored ventilation, respiratory function stable and patient connected to nasal cannula oxygen Cardiovascular status: blood pressure returned to baseline and stable Postop Assessment: no signs of nausea or vomiting Anesthetic complications: no    Last Vitals:  Vitals:   04/20/17 0610 04/20/17 1046  BP: 122/75 115/70  Pulse: 70 63  Resp: 16 14  Temp: 36.8 C 36.8 C  SpO2: 99% 100%    Last Pain:  Vitals:   04/20/17 1101  TempSrc:   PainSc: 3                  Virginio Isidore,JAMES TERRILL

## 2017-04-20 NOTE — Progress Notes (Signed)
Patient ID: Misty Oneill, female   DOB: [birth year], 21 y.o.   MRN: [id number]  [facility] Progress Note  1 Day Post-Op  Subjective: CC- s/p lap appy Patient states that she is sore, but feeling better than last night. She was able to ambulate in the halls yesterday and tolerated clear liquids. Feels hungry this morning. Denies n/v. She did require IV morphine last night.  Objective: Vital signs in last 24 hours: Temp:  [98.1 F (36.7 C)-99 F (37.2 C)] 98.2 F (36.8 C) (08/28 0610) Pulse Rate:  [58-75] 70 (08/28 0610) Resp:  [10-16] 16 (08/28 0610) BP: (122-147)/(72-88) 122/75 (08/28 0610) SpO2:  [98 %-100 %] 99 % (08/28 0610) Weight:  [226 lb (102.5 kg)] 226 lb (102.5 kg) (08/27 1016) Last BM Date: 04/18/17  Intake/Output from previous day: 08/27 0701 - 08/28 0700 In: 2583.3 [I.V.:2418.3; IV Piggyback:165] Out: 710 [Urine:700; Blood:10] Intake/Output this shift: No intake/output data recorded.  PE: Gen:  Alert, NAD, pleasant HEENT: EOM's intact, pupils equal and round Card:  RRR, no M/G/R heard Pulm:  effort normal Abd: Soft, ND, appropriately tender, +BS, multiple lap incisions with clean/dry dressings in place Skin: no rashes noted, warm and dry  Lab Results:   Recent Labs  04/18/17 1712 04/19/17 0402  WBC 8.2 7.0  HGB 12.3 11.3*  HCT 37.1 34.1*  PLT 348 299   BMET  Recent Labs  04/18/17 1712  NA 136  K 3.7  CL 105  CO2 24  GLUCOSE 81  BUN 7  CREATININE 0.57  CALCIUM 8.8*   PT/INR No results for input(s): LABPROT, INR in the last 72 hours. CMP     Component Value Date/Time   NA 136 04/18/2017 1712   K 3.7 04/18/2017 1712   CL 105 04/18/2017 1712   CO2 24 04/18/2017 1712   GLUCOSE 81 04/18/2017 1712   BUN 7 04/18/2017 1712   CREATININE 0.57 04/18/2017 1712   CALCIUM 8.8 (L) 04/18/2017 1712   PROT 7.1 04/18/2017 1712   ALBUMIN 3.6 04/18/2017 1712   AST 20 04/18/2017 1712   ALT 25 04/18/2017 1712   ALKPHOS 48  04/18/2017 1712   BILITOT 0.4 04/18/2017 1712   GFRNONAA >60 04/18/2017 1712   GFRAA >60 04/18/2017 1712   Lipase     Component Value Date/Time   LIPASE 20 04/18/2017 1712       Studies/Results: Ct Abdomen Pelvis W Contrast  Result Date: 04/18/2017 CLINICAL DATA:  Abdominal pain with nausea EXAM: CT ABDOMEN AND PELVIS WITH CONTRAST TECHNIQUE: Multidetector CT imaging of the abdomen and pelvis was performed using the standard protocol following bolus administration of intravenous contrast. CONTRAST:  187mL ISOVUE-300 IOPAMIDOL (ISOVUE-300) INJECTION 61% COMPARISON:  None. FINDINGS: Lower chest: No acute abnormality. Hepatobiliary: No focal liver abnormality is seen. No gallstones, gallbladder wall thickening, or biliary dilatation. Pancreas: Unremarkable. No pancreatic ductal dilatation or surrounding inflammatory changes. Spleen: Normal in size without focal abnormality. Adrenals/Urinary Tract: Adrenal glands are unremarkable. Kidneys are normal, without renal calculi, focal lesion, or hydronephrosis. Bladder is unremarkable. Stomach/Bowel: Stomach is within normal limits. Proximal appendix is of normal caliber. Distal appendix and tip borderline to slightly enlarged, measuring up to 8 mm, but not much surrounding inflammation. No evidence of bowel wall thickening, distention, or inflammatory changes. Vascular/Lymphatic: Non aneurysmal aorta. No significantly enlarged lymph nodes Reproductive: Uterus and bilateral adnexa are unremarkable. Other: Small free fluid in the pelvis. Negative for free air. Small fat in the umbilicus. Musculoskeletal: No acute or significant osseous  findings. IMPRESSION: 1. Borderline enlarged distal appendix an appendix tip uptake 8 mm but not much surrounding inflammation, clinical correlation recommended. 2. Small amount of free fluid in the pelvis. There are no other significant abnormalities seen. Electronically Signed   By: Donavan Foil M.D.   On: 04/18/2017 20:00     Anti-infectives: Anti-infectives    Start     Dose/Rate Route Frequency Ordered Stop   04/19/17 1015  cefTRIAXone (ROCEPHIN) 2 g in dextrose 5 % 50 mL IVPB     2 g 100 mL/hr over 30 Minutes Intravenous  Once 04/19/17 0828 04/19/17 1135   04/19/17 1015  metroNIDAZOLE (FLAGYL) IVPB 500 mg     500 mg 100 mL/hr over 60 Minutes Intravenous  Once 04/19/17 3790 04/19/17 1152       Assessment/Plan Acute appendicitis S/p Laparoscopic appendectomy 8/27 Dr. Zella Richer - POD - tolerating clears - required IV pain medication over night  ID - rocephin/flagyl perioperative FEN - regular diet VTE - SCDs, lovenox Foley - d/c POD 1 Follow up - 2-3 weeks in [place] - Advance to regular diet. Schedule tylenol and ibuprofen for better pain control. Will recheck later today for possible discharge.   LOS: 0 days    Wellington Hampshire , [facility] 04/20/2017, 7:44 AM Pager: [phone] Consults: [phone] Mon-Fri 7:00 am-4:30 pm Sat-Sun 7:00 am-11:30 am

## 2018-04-13 IMAGING — CT CT ABD-PELV W/ CM
2 of 4 series · 16 of 46 positions shown, 18 images · IV contrast (ISOVUE)
Comparison: None.

CLINICAL DATA: Abdominal pain with nausea

EXAM:
CT ABDOMEN AND PELVIS WITH CONTRAST
TECHNIQUE: Multidetector CT imaging of the abdomen and pelvis was performed
using the standard protocol following bolus administration of
intravenous contrast.
CONTRAST:  100mL [id number] IOPAMIDOL (XRC892-C33) INJECTION 61%

[Series 2: abd/pel with · axial · 0.86mm/px · z∈[+1024,+1469]mm · 13 of 99 slices shown, 15 images]
[im 5/99  soft-tissue]
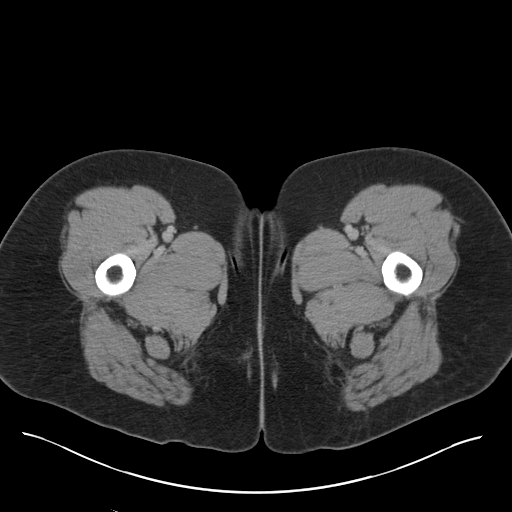
[im 5/99  bone]
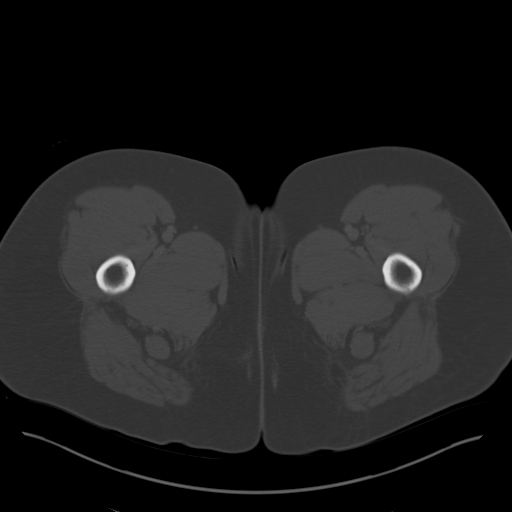
[im 15/99  soft-tissue]
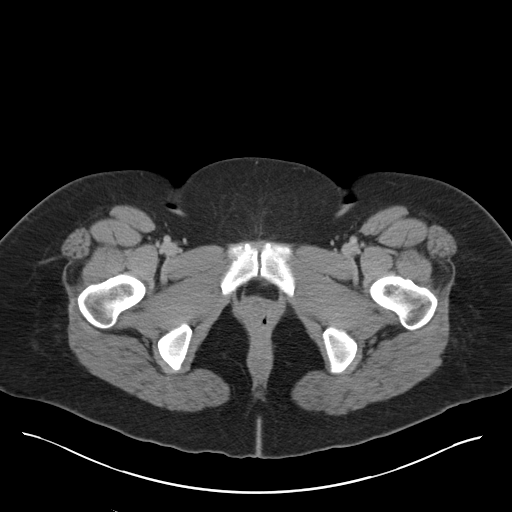
[im 20/99  soft-tissue]
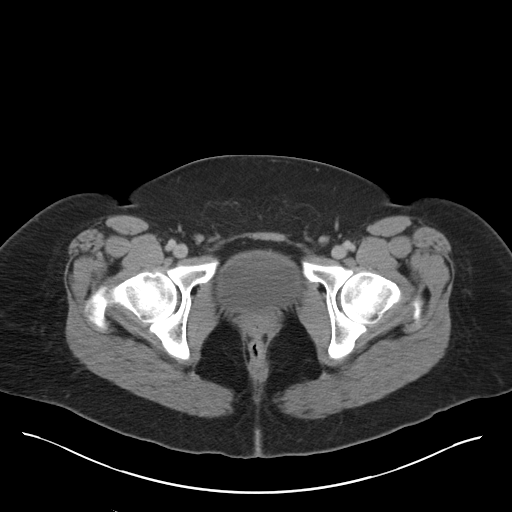
[im 30/99  soft-tissue]
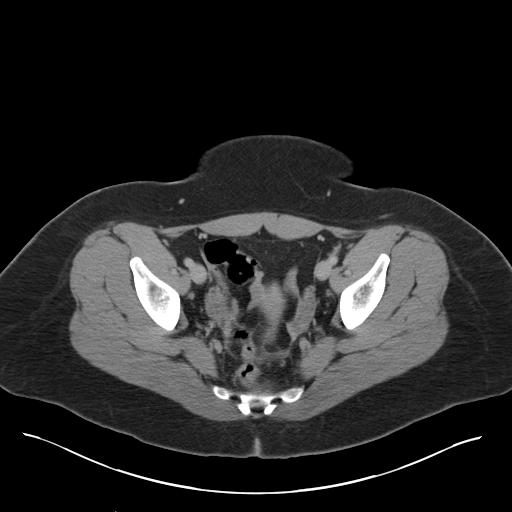
[im 35/99  soft-tissue]
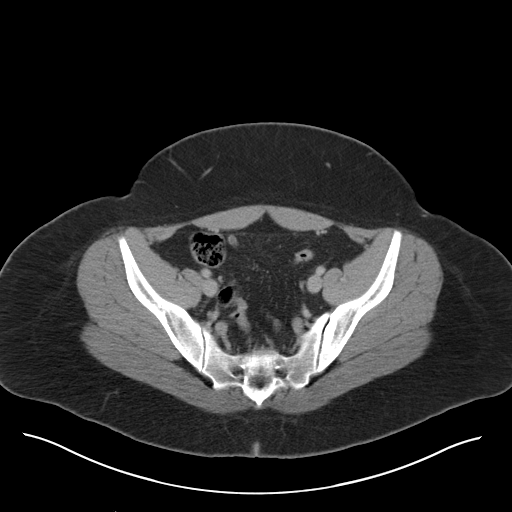
[im 45/99  soft-tissue]
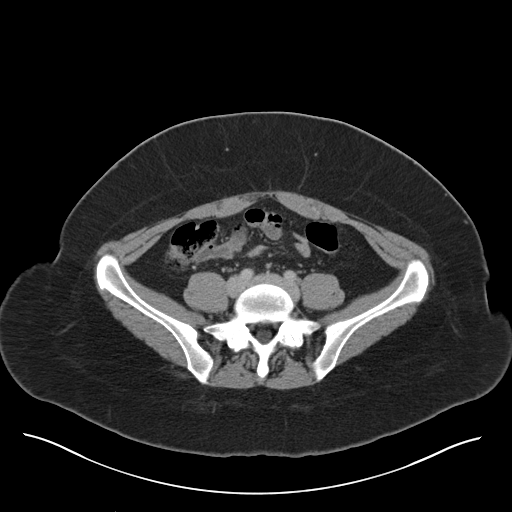
[im 50/99  soft-tissue]
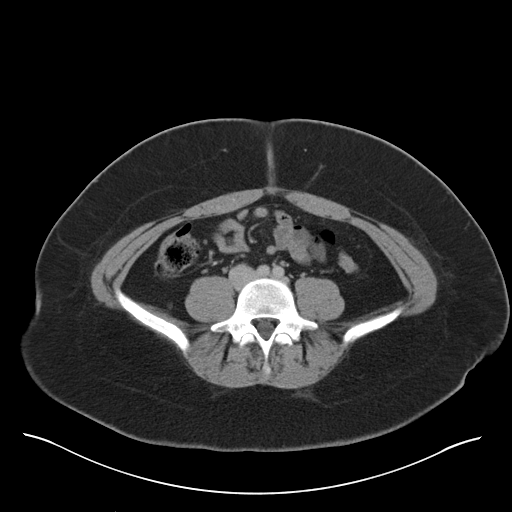
[im 54/99  soft-tissue]
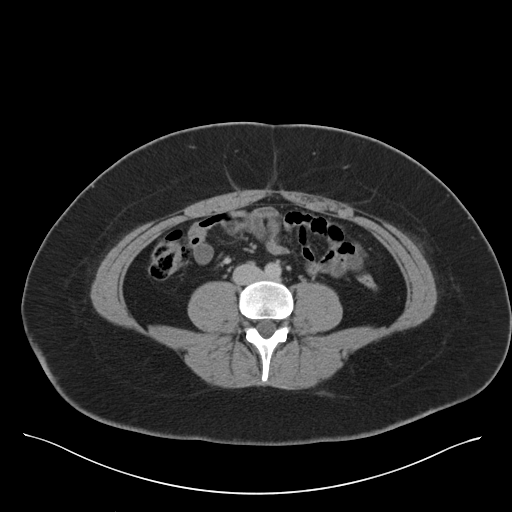
[im 64/99  soft-tissue]
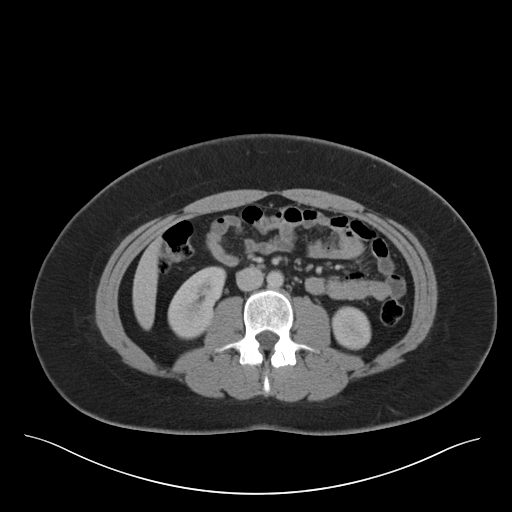
[im 64/99  bone]
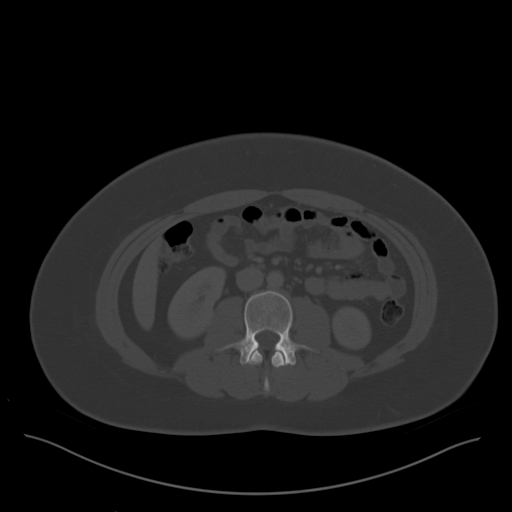
[im 69/99  soft-tissue]
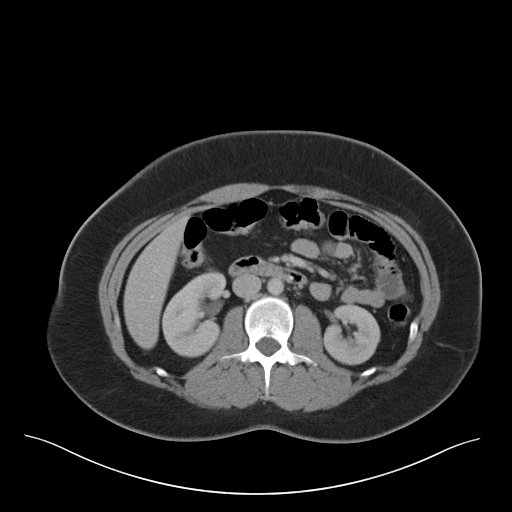
[im 79/99  soft-tissue]
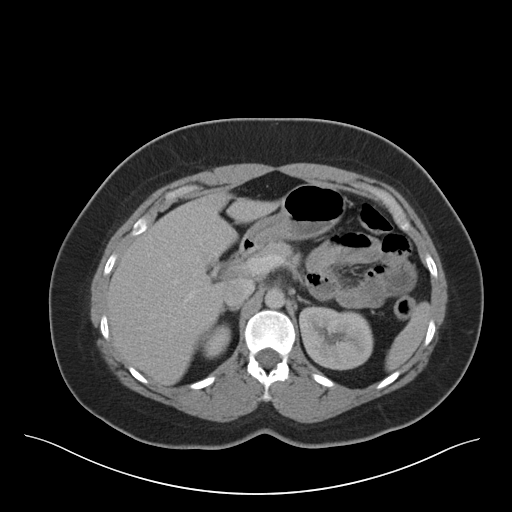
[im 84/99  soft-tissue]
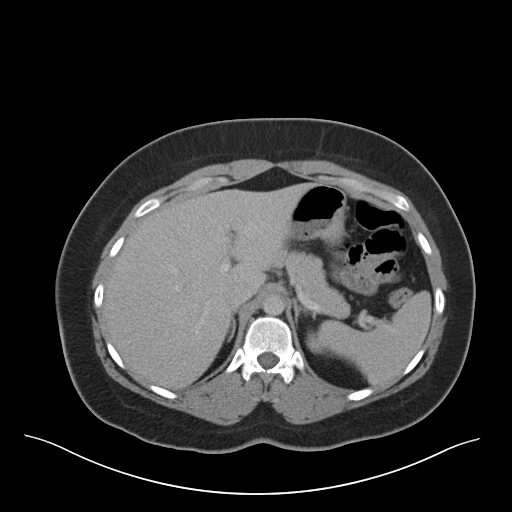
[im 94/99  soft-tissue]
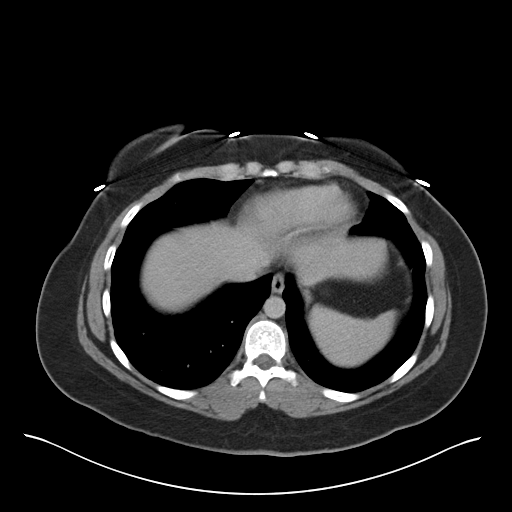

[Series 3: coronal a/|p · coronal · 0.74mm/px · 3 of 189 slices shown]
[im 63/189  soft-tissue]
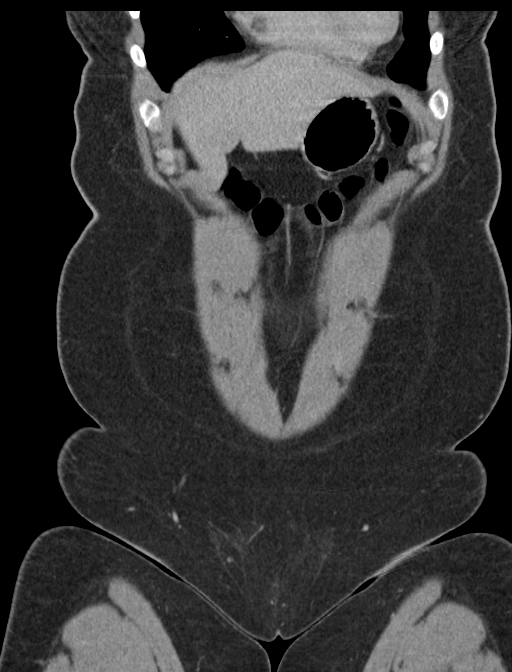
[im 84/189  soft-tissue]
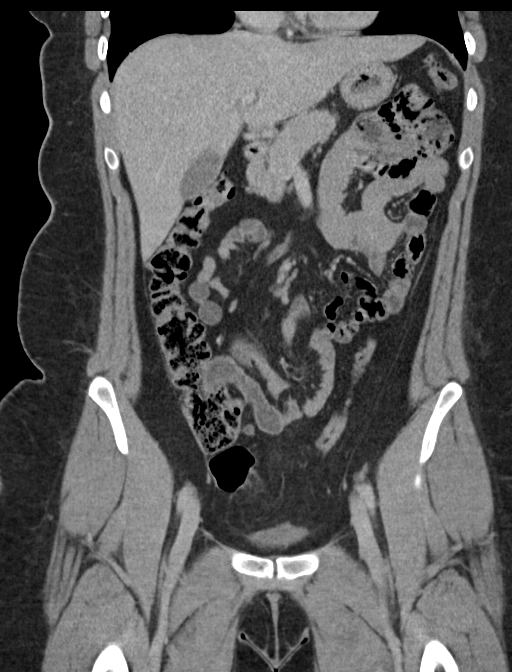
[im 105/189  soft-tissue]
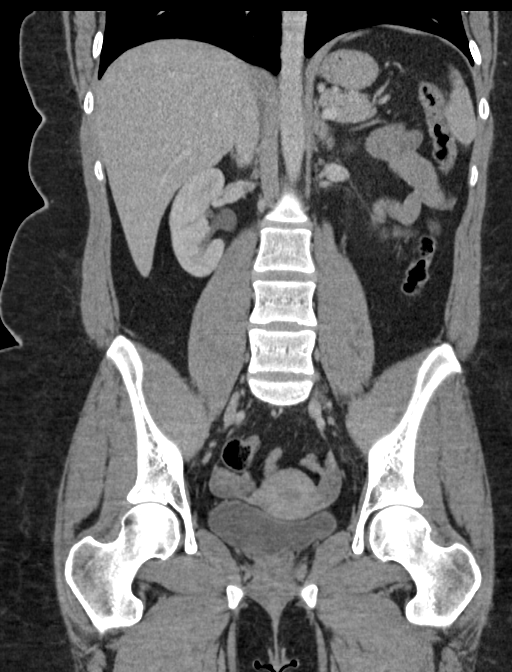

[16 of 46 positions shown; findings below may reference images not displayed]

FINDINGS: Lower chest: No acute abnormality.

Hepatobiliary: No focal liver abnormality is seen. No gallstones,
gallbladder wall thickening, or biliary dilatation.

Pancreas: Unremarkable. No pancreatic ductal dilatation or
surrounding inflammatory changes.

Spleen: Normal in size without focal abnormality.

Adrenals/Urinary Tract: Adrenal glands are unremarkable. Kidneys are
normal, without renal calculi, focal lesion, or hydronephrosis.
Bladder is unremarkable.

Stomach/Bowel: Stomach is within normal limits. Proximal appendix is
of normal caliber. Distal appendix and tip borderline to slightly
enlarged, measuring up to 8 mm, but not much surrounding
inflammation. No evidence of bowel wall thickening, distention, or
inflammatory changes.

Vascular/Lymphatic: Non aneurysmal aorta. No significantly enlarged
lymph nodes

Reproductive: Uterus and bilateral adnexa are unremarkable.

Other: Small free fluid in the pelvis. Negative for free air. Small
fat in the umbilicus.

Musculoskeletal: No acute or significant osseous findings.
IMPRESSION: 1. Borderline enlarged distal appendix an appendix tip uptake 8 mm
but not much surrounding inflammation, clinical correlation
recommended.
2. Small amount of free fluid in the pelvis. There are no other
significant abnormalities seen.

## 2018-12-09 DIAGNOSIS — O163 Unspecified maternal hypertension, third trimester: Secondary | ICD-10-CM | POA: Insufficient documentation

## 2020-10-04 ENCOUNTER — Ambulatory Visit: Payer: Managed Care, Other (non HMO) | Admitting: Family Medicine

## 2020-10-08 ENCOUNTER — Ambulatory Visit (INDEPENDENT_AMBULATORY_CARE_PROVIDER_SITE_OTHER): Payer: BC Managed Care – PPO | Admitting: Family Medicine

## 2020-10-08 ENCOUNTER — Other Ambulatory Visit: Payer: Self-pay

## 2020-10-08 ENCOUNTER — Encounter: Payer: Self-pay | Admitting: Family Medicine

## 2020-10-08 VITALS — BP 122/68 | HR 82 | Temp 98.0°F | Ht 68.0 in | Wt 235.8 lb

## 2020-10-08 DIAGNOSIS — D1724 Benign lipomatous neoplasm of skin and subcutaneous tissue of left leg: Secondary | ICD-10-CM | POA: Diagnosis not present

## 2020-10-08 DIAGNOSIS — E282 Polycystic ovarian syndrome: Secondary | ICD-10-CM | POA: Diagnosis not present

## 2020-10-08 DIAGNOSIS — M7989 Other specified soft tissue disorders: Secondary | ICD-10-CM

## 2020-10-08 NOTE — Progress Notes (Signed)
[facility] [address] [place] [address] Dept: [phone] Dept Fax: [phone]  New Patient Office Visit  Subjective:    Patient ID: Misty Oneill, female    DOB: 09-06-1995, 25 y.o..   MRN: [id number]  Chief Complaint  Patient presents with  . Establish Care    New patient, concerns about swelling in legs  x 1 year, also knot on lower left leg numb when touching patient noticed 1 month ago.     History of Present Illness:  Patient is in today to establish care. Misty Oneill is G1 P1, having delivered a daughter on 01/24/2019 via C-section due to fetal bradycardia. She notes that there were concerns during her pregnancy for high blood pressure. She had assessments for pre-eclampsia, but this was never diagnosed. She was not treated with any BP meds. Just prior to that pregnancy she was diagnosed with PCOS. She had a history of menorrhagia and dysmenorrhea. Currently, she is not on birth control, as she has had difficulty finding a n OCP that does not cause her to have breakthrough bleeding. She is working with her GYN on this.  Misty Oneill notes that since the birth of her daughter, she has had swelling in both lower legs. During the pregnancy, she used pressure stockings, but doesn't now. She notes the swelling can fluctuate at times.  Over the past month, Misty Oneill has noted a knot in her left lower leg. This also fluctuates in size. It has not been painful. She does not recall any specific trauma to this area.  Past Medical History: Patient Active Problem List   Diagnosis Date Noted  . Polycystic ovarian syndrome 10/08/2020  . Elevated blood pressure affecting pregnancy in third trimester, antepartum 12/09/2018  . Right lower quadrant abdominal pain 04/18/2017   Past Surgical History:  Procedure Laterality Date  . CESAREAN SECTION    . LAPAROSCOPIC APPENDECTOMY N/A 04/19/2017   Procedure: APPENDECTOMY  LAPAROSCOPIC;  Surgeon: Jackolyn Confer, MD;  Location: [facility];  Service: General;  Laterality: N/A;  . WISDOM TOOTH EXTRACTION     Family History  Problem Relation Age of Onset  . Diabetes Mother   . Heart disease Mother   . Polycystic ovary syndrome Mother   . Healthy Father   . Arthritis Maternal Grandmother   . Healthy Paternal Grandmother   . Healthy Paternal Grandfather    Outpatient Medications Prior to Visit  Medication Sig Dispense Refill  . norgestimate-ethinyl estradiol (ORTHO-CYCLEN,SPRINTEC,PREVIFEM) 0.25-35 MG-MCG tablet Take 1 tablet by mouth daily. (Patient not taking: Reported on 10/08/2020)    . oxyCODONE (OXY IR/ROXICODONE) 5 MG immediate release tablet Take 1 tablet (5 mg total) by mouth every 4 (four) hours as needed for moderate pain or severe pain. (Patient not taking: Reported on 10/08/2020) 20 tablet 0   No facility-administered medications prior to visit.   No Known Allergies    Objective:   Today's Vitals   10/08/20 1514  BP: 122/68  Pulse: 82  Temp: 98 F (36.7 C)  TempSrc: Temporal  SpO2: 99%  Weight: 235 lb 12.8 oz (107 kg)  Height: 5\' 8"  (1.727 m)   Body mass index is 35.85 kg/m.   General: Well developed, well nourished. No acute distress. Extremities: There is no pitting edema to the lower legs, but they di have a general tight feeling to them. There is a   3 cm smooth rounded mass deep under the skin just medial to the mid-tibia. There  is no associated redness or   tenderness. Psych: Alert and oriented. Normal mood and affect.  Health Maintenance Due  Topic Date Due  . Hepatitis C Screening  Never done     Assessment & Plan:   1. Leg swelling I will check a CMP and TSH to look for underlying issues that could be causing some milder lower leg swelling. If negative, this may represent venous sequelae from her pregnancy.  - Comprehensive metabolic panel - TSH  2. Lipoma of left lower extremity We discussed the benign nature of  this lesion. If enlargement of the lesion or pain develops, would consider referral for removal.  3. Polycystic ovarian syndrome Will screen for diabetes and lipid issues, which are related to PCOS.  - Hemoglobin A1c - Lipid panel  Haydee Salter, MD

## 2020-10-09 LAB — COMPREHENSIVE METABOLIC PANEL
ALT: 14 U/L (ref 0–35)
AST: 12 U/L (ref 0–37)
Albumin: 4.2 g/dL (ref 3.5–5.2)
Alkaline Phosphatase: 62 U/L (ref 39–117)
BUN: 9 mg/dL (ref 6–23)
CO2: 29 mEq/L (ref 19–32)
Calcium: 9.2 mg/dL (ref 8.4–10.5)
Chloride: 103 mEq/L (ref 96–112)
Creatinine, Ser: 0.67 mg/dL (ref 0.40–1.20)
GFR: 121.13 mL/min (ref >60.00)
Glucose, Bld: 90 mg/dL (ref 70–99)
Potassium: 3.7 mEq/L (ref 3.5–5.1)
Sodium: 139 mEq/L (ref 135–145)
Total Bilirubin: 0.3 mg/dL (ref 0.2–1.2)
Total Protein: 7 g/dL (ref 6.0–8.3)

## 2020-10-09 LAB — LIPID PANEL
Cholesterol: 183 mg/dL (ref 0–200)
HDL: 53.9 mg/dL (ref >39.00)
LDL Cholesterol: 113 mg/dL — ABNORMAL HIGH (ref 0–99)
NonHDL: 128.83
Total CHOL/HDL Ratio: 3
Triglycerides: 80 mg/dL (ref 0.0–149.0)
VLDL: 16 mg/dL (ref 0.0–40.0)

## 2020-10-09 LAB — TSH: TSH: 0.92 u[IU]/mL (ref 0.35–4.50)

## 2020-10-09 LAB — HEMOGLOBIN A1C: Hgb A1c MFr Bld: 5.5 % (ref 4.6–6.5)

## 2020-11-06 ENCOUNTER — Encounter: Payer: Self-pay | Admitting: Family Medicine

## 2020-11-06 ENCOUNTER — Other Ambulatory Visit: Payer: Self-pay

## 2020-11-06 ENCOUNTER — Ambulatory Visit (INDEPENDENT_AMBULATORY_CARE_PROVIDER_SITE_OTHER): Payer: BC Managed Care – PPO | Admitting: Family Medicine

## 2020-11-06 ENCOUNTER — Other Ambulatory Visit (HOSPITAL_COMMUNITY)
Admission: RE | Admit: 2020-11-06 | Discharge: 2020-11-06 | Disposition: A | Discharge status: Home / Self Care | Payer: BC Managed Care – PPO | Source: Ambulatory Visit | Attending: Family Medicine | Admitting: Family Medicine

## 2020-11-06 VITALS — BP 129/71 | HR 65 | Resp 16 | Ht 67.0 in | Wt 236.0 lb

## 2020-11-06 DIAGNOSIS — R6882 Decreased libido: Secondary | ICD-10-CM | POA: Insufficient documentation

## 2020-11-06 DIAGNOSIS — Z01411 Encounter for gynecological examination (general) (routine) with abnormal findings: Secondary | ICD-10-CM | POA: Insufficient documentation

## 2020-11-06 DIAGNOSIS — E282 Polycystic ovarian syndrome: Secondary | ICD-10-CM

## 2020-11-06 DIAGNOSIS — Z124 Encounter for screening for malignant neoplasm of cervix: Secondary | ICD-10-CM

## 2020-11-06 NOTE — Progress Notes (Signed)
  Subjective:     Misty Oneill is a 25 y.o. female and is here for a comprehensive physical exam. The patient reports no problems. Has PCOS (string of pearls noted on [place]), previously on birth control and was bleeding 2x/month. Then got better.Similar problem with patch. Notes sharp pains during intercourse. Decreased sexual desire. Cycles come once monthly and lasts about 1 week.   The following portions of the patient's history were reviewed and updated as appropriate: allergies, current medications, past family history, past medical history, past social history, past surgical history and problem list.  Review of Systems Pertinent items noted in HPI and remainder of comprehensive ROS otherwise negative.   Objective:    BP 129/71   Pulse 65   Resp 16   Ht 5\' 7"  (1.702 m)   Wt 236 lb (107 kg)   LMP 10/20/2020   BMI 36.96 kg/m  General appearance: alert, cooperative and appears stated age Head: Normocephalic, without obvious abnormality, atraumatic Neck: no adenopathy, supple, symmetrical, trachea midline and thyroid not enlarged, symmetric, no tenderness/mass/nodules Lungs: clear to auscultation bilaterally Breasts: normal appearance, no masses or tenderness Heart: regular rate and rhythm, S1, S2 normal, no murmur, click, rub or gallop Abdomen: soft, non-tender; bowel sounds normal; no masses,  no organomegaly Pelvic: cervix normal in appearance, external genitalia normal, no adnexal masses or tenderness, no cervical motion tenderness, uterus normal size, shape, and consistency and vagina normal without discharge Extremities: extremities normal, atraumatic, no cyanosis or edema Pulses: 2+ and symmetric Skin: Skin color, texture, turgor normal. No rashes or lesions Lymph nodes: Cervical, supraclavicular, and axillary nodes normal. Neurologic: Grossly normal    Assessment:    Healthy female exam.      Plan:      Problem List Items Addressed This Visit       Unprioritized   Polycystic ovarian syndrome - Primary    Reports monthly cycles. Risks of going without a cycle and possible infertility discussed. She is ok without contraception at this time. Can consider monthly progesterone if needed for endometrial protection or progestin containing IUD if needed for same + contraception. Femara or Clomid for ovulation induction if needed, but now having regular cycles, argues against the need for any of this.      Decreased libido    Discussed reasons, and no good treatment options. Will readdress as needed.       Other Visit Diagnoses    Screening for malignant neoplasm of cervix       Encounter for gynecological examination with abnormal finding       Relevant Orders   Cytology - PAP( )     Return in 1 year (on 11/06/2021).  See After Visit Summary for Counseling Recommendations

## 2020-11-06 NOTE — Assessment & Plan Note (Signed)
Discussed reasons, and no good treatment options. Will readdress as needed.

## 2020-11-06 NOTE — Assessment & Plan Note (Signed)
Reports monthly cycles. Risks of going without a cycle and possible infertility discussed. She is ok without contraception at this time. Can consider monthly progesterone if needed for endometrial protection or progestin containing IUD if needed for same + contraception. Femara or Clomid for ovulation induction if needed, but now having regular cycles, argues against the need for any of this.

## 2020-11-06 NOTE — Patient Instructions (Signed)
Preventive Care 11-26 Years Old, Female Preventive care refers to lifestyle choices and visits with your health care provider that can promote health and wellness. This includes:  A yearly physical exam. This is also called an annual wellness visit.  Regular dental and eye exams.  Immunizations.  Screening for certain conditions.  Healthy lifestyle choices, such as: ? Eating a healthy diet. ? Getting regular exercise. ? Not using drugs or products that contain nicotine and tobacco. ? Limiting alcohol use. What can I expect for my preventive care visit? Physical exam Your health care provider may check your:  Height and weight. These may be used to calculate your BMI (body mass index). BMI is a measurement that tells if you are at a healthy weight.  Heart rate and blood pressure.  Body temperature.  Skin for abnormal spots. Counseling Your health care provider may ask you questions about your:  Past medical problems.  Family's medical history.  Alcohol, tobacco, and drug use.  Emotional well-being.  Home life and relationship well-being.  Sexual activity.  Diet, exercise, and sleep habits.  Work and work Statistician.  Access to firearms.  Method of birth control.  Menstrual cycle.  Pregnancy history. What immunizations do I need? Vaccines are usually given at various ages, according to a schedule. Your health care provider will recommend vaccines for you based on your age, medical history, and lifestyle or other factors, such as travel or where you work.   What tests do I need? Blood tests  Lipid and cholesterol levels. These may be checked every 5 years starting at age 64.  Hepatitis C test.  Hepatitis B test. Screening  Diabetes screening. This is done by checking your blood sugar (glucose) after you have not eaten for a while (fasting).  STD (sexually transmitted disease) testing, if you are at risk.  BRCA-related cancer screening. This may  be done if you have a family history of breast, ovarian, tubal, or peritoneal cancers.  Pelvic exam and Pap test. This may be done every 3 years starting at age 61. Starting at age 82, this may be done every 5 years if you have a Pap test in combination with an HPV test. Talk with your health care provider about your test results, treatment options, and if necessary, the need for more tests.   Follow these instructions at home: Eating and drinking  Eat a healthy diet that includes fresh fruits and vegetables, whole grains, lean protein, and low-fat dairy products.  Take vitamin and mineral supplements as recommended by your health care provider.  Do not drink alcohol if: ? Your health care provider tells you not to drink. ? You are pregnant, may be pregnant, or are planning to become pregnant.  If you drink alcohol: ? Limit how much you have to 0-1 drink a day. ? Be aware of how much alcohol is in your drink. In the [place] one drink equals one 12 oz bottle of beer (355 mL), one 5 oz glass of wine (148 mL), or one 1 oz glass of hard liquor (44 mL).   Lifestyle  Take daily care of your teeth and gums. Brush your teeth every morning and night with fluoride toothpaste. Floss one time each day.  Stay active. Exercise for at least 30 minutes 5 or more days each week.  Do not use any products that contain nicotine or tobacco, such as cigarettes, e-cigarettes, and chewing tobacco. If you need help quitting, ask your health care provider.  Do  not use drugs.  If you are sexually active, practice safe sex. Use a condom or other form of protection to prevent STIs (sexually transmitted infections).  If you do not wish to become pregnant, use a form of birth control. If you plan to become pregnant, see your health care provider for a prepregnancy visit.  Find healthy ways to cope with stress, such as: ? Meditation, yoga, or listening to music. ? Journaling. ? Talking to a trusted  person. ? Spending time with friends and family. Safety  Always wear your seat belt while driving or riding in a vehicle.  Do not drive: ? If you have been drinking alcohol. Do not ride with someone who has been drinking. ? When you are tired or distracted. ? While texting.  Wear a helmet and other protective equipment during sports activities.  If you have firearms in your house, make sure you follow all gun safety procedures.  Seek help if you have been physically or sexually abused. What's next?  Go to your health care provider once a year for an annual wellness visit.  Ask your health care provider how often you should have your eyes and teeth checked.  Stay up to date on all vaccines. This information is not intended to replace advice given to you by your health care provider. Make sure you discuss any questions you have with your health care provider. Document Revised: 04/07/2020 Document Reviewed: 04/21/2018 Elsevier Patient Education  2021 [employer].

## 2020-11-07 LAB — CYTOLOGY - PAP
Chlamydia: NEGATIVE
Comment: NEGATIVE
Comment: NORMAL
Diagnosis: NEGATIVE
Neisseria Gonorrhea: NEGATIVE

## 2020-11-08 ENCOUNTER — Encounter: Payer: Self-pay | Admitting: Obstetrics & Gynecology

## 2020-12-18 ENCOUNTER — Emergency Department (HOSPITAL_COMMUNITY)
Admission: EM | Admit: 2020-12-18 | Discharge: 2020-12-18 | Disposition: A | Discharge status: Home / Self Care | Payer: BC Managed Care – PPO | Attending: Emergency Medicine | Admitting: Emergency Medicine

## 2020-12-18 ENCOUNTER — Emergency Department (HOSPITAL_COMMUNITY): Payer: BC Managed Care – PPO

## 2020-12-18 ENCOUNTER — Encounter (HOSPITAL_COMMUNITY): Payer: Self-pay

## 2020-12-18 ENCOUNTER — Other Ambulatory Visit: Payer: Self-pay

## 2020-12-18 DIAGNOSIS — R2243 Localized swelling, mass and lump, lower limb, bilateral: Secondary | ICD-10-CM | POA: Diagnosis not present

## 2020-12-18 DIAGNOSIS — R1032 Left lower quadrant pain: Secondary | ICD-10-CM | POA: Insufficient documentation

## 2020-12-18 DIAGNOSIS — R072 Precordial pain: Secondary | ICD-10-CM | POA: Insufficient documentation

## 2020-12-18 DIAGNOSIS — M7989 Other specified soft tissue disorders: Secondary | ICD-10-CM

## 2020-12-18 DIAGNOSIS — R109 Unspecified abdominal pain: Secondary | ICD-10-CM

## 2020-12-18 LAB — COMPREHENSIVE METABOLIC PANEL
ALT: 21 U/L (ref 0–44)
AST: 18 U/L (ref 15–41)
Albumin: 3.9 g/dL (ref 3.5–5.0)
Alkaline Phosphatase: 54 U/L (ref 38–126)
Anion gap: 9 (ref 5–15)
BUN: 8 mg/dL (ref 6–20)
CO2: 24 mmol/L (ref 22–32)
Calcium: 8.9 mg/dL (ref 8.9–10.3)
Chloride: 106 mmol/L (ref 98–111)
Creatinine, Ser: 0.62 mg/dL (ref 0.44–1.00)
GFR, Estimated: >60 mL/min (ref >60)
Glucose, Bld: 84 mg/dL (ref 70–99)
Potassium: 3.9 mmol/L (ref 3.5–5.1)
Sodium: 139 mmol/L (ref 135–145)
Total Bilirubin: 0.1 mg/dL — ABNORMAL LOW (ref 0.3–1.2)
Total Protein: 6.9 g/dL (ref 6.5–8.1)

## 2020-12-18 LAB — URINALYSIS, ROUTINE W REFLEX MICROSCOPIC
Bilirubin Urine: NEGATIVE
Glucose, UA: NEGATIVE mg/dL
Hgb urine dipstick: NEGATIVE
Ketones, ur: NEGATIVE mg/dL
Nitrite: NEGATIVE
Protein, ur: NEGATIVE mg/dL
Specific Gravity, Urine: 1.009 (ref 1.005–1.030)
pH: 8 (ref 5.0–8.0)

## 2020-12-18 LAB — CBC WITH DIFFERENTIAL/PLATELET
Abs Immature Granulocytes: 0.01 10*3/uL (ref 0.00–0.07)
Basophils Absolute: 0 10*3/uL (ref 0.0–0.1)
Basophils Relative: 1 %
Eosinophils Absolute: 0 10*3/uL (ref 0.0–0.5)
Eosinophils Relative: 1 %
HCT: 40.7 % (ref 36.0–46.0)
Hemoglobin: 13.1 g/dL (ref 12.0–15.0)
Immature Granulocytes: 0 %
Lymphocytes Relative: 18 %
Lymphs Abs: 0.8 10*3/uL (ref 0.7–4.0)
MCH: 27.9 pg (ref 26.0–34.0)
MCHC: 32.2 g/dL (ref 30.0–36.0)
MCV: 86.6 fL (ref 80.0–100.0)
Monocytes Absolute: 0.5 10*3/uL (ref 0.1–1.0)
Monocytes Relative: 11 %
Neutro Abs: 2.9 10*3/uL (ref 1.7–7.7)
Neutrophils Relative %: 69 %
Platelets: 264 10*3/uL (ref 150–400)
RBC: 4.7 MIL/uL (ref 3.87–5.11)
RDW: 12.9 % (ref 11.5–15.5)
WBC: 4.2 10*3/uL (ref 4.0–10.5)
nRBC: 0 % (ref 0.0–0.2)

## 2020-12-18 LAB — TROPONIN I (HIGH SENSITIVITY): Troponin I (High Sensitivity): <2 ng/L (ref <18)

## 2020-12-18 LAB — I-STAT BETA HCG BLOOD, ED (MC, WL, AP ONLY): I-stat hCG, quantitative: <5 m[IU]/mL (ref <5)

## 2020-12-18 LAB — LIPASE, BLOOD: Lipase: 23 U/L (ref 11–51)

## 2020-12-18 NOTE — ED Notes (Addendum)
PA at pt bedside. Per PA: 2nd trop not needed at this time

## 2020-12-18 NOTE — ED Notes (Signed)
PA requested cardiac monitoring

## 2020-12-18 NOTE — ED Provider Notes (Addendum)
[facility] DEPT Provider Note   CSN: [id number] Arrival date & time: 12/18/20  0850     History Chief Complaint  Patient presents with  . Flank Pain  . Chest Pain    Misty Oneill is a 25 y.o. female presents with a chief complaint of leg swelling, midsternal chest pain, and left lower quadrant pain.  Patient reports that her chest pain has been present over the last week.  Pain is intermittent.  Pain does not radiate.  Described as a dull ache.  Patient rates her pain 1/10 on the pain scale at present.  Pain is worse when laying down.  Patient denies any alleviating factors.  Denies any associated shortness of breath, nausea, vomiting, or diaphoresis.  Pain does not change with eating or exertion.  Patient endorses left lower quadrant/flank pain that began this morning.  Pain began after patient was bending over.  Patient reports he felt a sharp pain while bending over.  Pain was originally 9/10 on the pain scale however has improved since then.     Patient endorses bilateral lower leg swelling.  This has been present since her pregnancy in 2020.  Patient reports that swelling is into her calf and ankles.  Swelling worsens throughout the day.  Patient works at [employer] and is on her feet throughout most of the day.  Patient reports that her symptoms will improve overnight.  Patient endorses occasional mild aching to bilateral lower extremities.  Patient does not wear any compression stockings.  LMP 1 month prior.   HPI     Past Medical History:  Diagnosis Date  . PCOS (polycystic ovarian syndrome)     Patient Active Problem List   Diagnosis Date Noted  . Decreased libido 11/06/2020  . Polycystic ovarian syndrome 10/08/2020  . Elevated blood pressure affecting pregnancy in third trimester, antepartum 12/09/2018  . Right lower quadrant abdominal pain 04/18/2017    Past Surgical History:  Procedure Laterality Date  . CESAREAN SECTION     . LAPAROSCOPIC APPENDECTOMY N/A 04/19/2017   Procedure: APPENDECTOMY LAPAROSCOPIC;  Surgeon: Jackolyn Confer, MD;  Location: [facility];  Service: General;  Laterality: N/A;  . WISDOM TOOTH EXTRACTION       OB History    Gravida  1   Para  1   Term  1   Preterm      AB      Living  1     SAB      IAB      Ectopic      Multiple      Live Births  1           Family History  Problem Relation Age of Onset  . Diabetes Mother   . Heart disease Mother   . Polycystic ovary syndrome Mother   . Healthy Father   . Arthritis Maternal Grandmother   . Healthy Paternal Grandmother   . Healthy Paternal Grandfather     Social History   Tobacco Use  . Smoking status: Never Smoker  . Smokeless tobacco: Never Used  Vaping Use  . Vaping Use: Never used  Substance Use Topics  . Alcohol use: Yes    Alcohol/week: 2.0 standard drinks    Types: 1 Glasses of wine, 1 Shots of liquor per week    Comment: social  . Drug use: No    Home Medications Prior to Admission medications   Not on File    Allergies  Patient has no known allergies.  Review of Systems   Review of Systems  Constitutional: Negative for chills, diaphoresis and fever.  Eyes: Negative for visual disturbance.  Respiratory: Negative for cough and shortness of breath.   Cardiovascular: Positive for chest pain and leg swelling.  Gastrointestinal: Positive for abdominal pain. Negative for abdominal distention, anal bleeding, blood in stool, constipation, diarrhea, nausea, rectal pain and vomiting.  Genitourinary: Positive for flank pain. Negative for difficulty urinating, dysuria, frequency, genital sores, hematuria, pelvic pain, vaginal bleeding, vaginal discharge and vaginal pain.  Musculoskeletal: Negative for back pain and neck pain.  Skin: Negative for color change and rash.  Neurological: Negative for dizziness, syncope, light-headedness and headaches.  Psychiatric/Behavioral: Negative for confusion.     Physical Exam Updated Vital Signs BP (!) 163/97 (BP Location: Left Arm)   Pulse 74   Temp 97.8 F (36.6 C) (Oral)   Resp 19   SpO2 100%   Physical Exam Vitals and nursing note reviewed.  Constitutional:      General: She is not in acute distress.    Appearance: She is not ill-appearing, toxic-appearing or diaphoretic.  HENT:     Head: Normocephalic.  Eyes:     General: No scleral icterus.       Right eye: No discharge.        Left eye: No discharge.  Cardiovascular:     Rate and Rhythm: Normal rate.     Heart sounds: Normal heart sounds.  Pulmonary:     Effort: Pulmonary effort is normal. No respiratory distress.     Breath sounds: Normal breath sounds. No stridor.  Abdominal:     General: Bowel sounds are normal. There is no distension. There are no signs of injury.     Palpations: Abdomen is soft. There is no mass or pulsatile mass.     Tenderness: There is abdominal tenderness in the left lower quadrant. There is no right CVA tenderness, left CVA tenderness, guarding or rebound.     Hernia: There is no hernia in the umbilical area or ventral area.     Comments: Mild tenderness to left lower quadrant  Musculoskeletal:     Cervical back: Neck supple.     Right lower leg: No swelling, deformity, lacerations, tenderness or bony tenderness. No edema.     Left lower leg: No swelling, deformity, lacerations, tenderness or bony tenderness. No edema.  Skin:    General: Skin is warm and dry.  Neurological:     General: No focal deficit present.     Mental Status: She is alert.  Psychiatric:        Behavior: Behavior is cooperative.     ED Results / Procedures / Treatments   Labs (all labs ordered are listed, but only abnormal results are displayed) Labs Reviewed  COMPREHENSIVE METABOLIC PANEL - Abnormal; Notable for the following components:      Result Value   Total Bilirubin 0.1 (*)    All other components within normal limits  URINALYSIS, ROUTINE W REFLEX  MICROSCOPIC - Abnormal; Notable for the following components:   APPearance HAZY (*)    Leukocytes,Ua MODERATE (*)    Bacteria, UA RARE (*)    All other components within normal limits  LIPASE, BLOOD  CBC WITH DIFFERENTIAL/PLATELET  I-STAT BETA HCG BLOOD, ED ([facility], [facility], [facility] ONLY)  TROPONIN I (HIGH SENSITIVITY)  TROPONIN I (HIGH SENSITIVITY)    EKG EKG Interpretation  Date/Time:  Wednesday December 18 2020 09:07:49 EDT Ventricular Rate:  81 PR Interval:  108 QRS Duration: 100 QT Interval:  384 QTC Calculation: 446 R Axis:   53 Text Interpretation: Sinus rhythm with short PR Otherwise normal ECG No acute changes No old tracing to compare Confirmed by Varney Biles [phone]) on 12/18/2020 11:26:09 AM   Radiology DG Chest Portable 1 View  Result Date: 12/18/2020 CLINICAL DATA:  Chest pain flank pain. EXAM: PORTABLE CHEST 1 VIEW COMPARISON:  CT 04/18/2017 FINDINGS: The heart size and mediastinal contours are within normal limits. Both lungs are clear. The visualized skeletal structures are unremarkable. IMPRESSION: No active disease. Electronically Signed   By: Nelson Chimes M.D.   On: 12/18/2020 09:45    Procedures Procedures   Medications Ordered in ED Medications - No data to display  ED Course  I have reviewed the triage vital signs and the nursing notes.  Pertinent labs & imaging results that were available during my care of the patient were reviewed by me and considered in my medical decision making (see chart for details).    MDM Rules/Calculators/A&P                          Alert 25 year old female no acute distress, nontoxic-appearing.  Patient presents with multiple complaints.  Complains of bilateral leg swelling that has been present since her last pregnancy in 2020.  Patient reports swelling to ankles and calf.  Swelling is worse when she is at work standing on her feet.  Patient does not wear any compression stockings.  Patient previously seen by her primary care  provider for this complaint.  On physical exam patient has no swelling, edema, or tenderness to bilateral lower legs.  Low suspicion for DVT.  Patient advised to use compression stockings and follow-up with her primary care provider.  Complains of midsternal chest pain that has been intermittent over the last week.  Patient describes this pain as "a dull pain."  No radiation of pain.  No associated nausea, vomiting, shortness of breath or diaphoresis.  No aggravation of symptoms with exertion or p.o. intake.  Will order chest x-ray, troponin, CMP, CBC, and EKG to evaluate for ACS.   EKG shows sinus rhythm with short PR interval.  No significant change from previous tracing. Chest x-ray shows no active cardiopulmonary disease. CMP and CBC are unremarkable. Troponin less than 2. Patient has heart score of 0.  Low suspicion for ACS at this time.  We will have patient follow-up with her primary care provider.   Low suspicion for PE as patient ruled out via [facility] criteria.  Complains of left lower quadrant/left flank pain.  Patient reports pain began this morning after she bent over at the waist to pick up an item.  Patient reports pain was sharp and gradually improved over time.  On physical exam patient's abdomen soft, nondistended, mild tenderness to left lower quadrant.  No CVA tenderness, guarding, or rebound tenderness.   Pregnancy test negative. Lipase within normal limits.   Urinalysis shows squamous epithelial 6-10, bacteria rare, leukocytes moderate, nitrite negative, WBC 0-5, RBC 0-5.  Patient has no urinary complaints.  Low suspicion for urinary tract infection at this time, bacteria seen are likely contamination from squamous epithelial cells. Low suspicion for ureteral or renal calculi as patient has no urinary symptoms, no CVA tenderness, no hematuria. Patient's left lower quadrant/flank pain likely due to musculoskeletal injury from bending and lifting.  Patient advised to use  over-the-counter pain medication as needed.  During  her time in the emergency department patient was noted to be hypertensive.  Patient advised to follow-up with her primary care provider regarding hypertension management.  Patient advised to document her blood pressures at home prior to seeing her primary care provider if possible.  Discussed results, findings, treatment and follow up. Patient advised of return precautions. Patient verbalized understanding and agreed with plan.    Final Clinical Impression(s) / ED Diagnoses Final diagnoses:  Precordial chest pain  Leg swelling  Left flank pain    Rx / DC Orders ED Discharge Orders    None       Loni Beckwith, PA-C 12/18/20 2135    Loni Beckwith, PA-C 12/18/20 2150    Varney Biles, MD 12/19/20 2231

## 2020-12-18 NOTE — Discharge Instructions (Addendum)
You came to the emergency department today to be evaluated for your chest pain, left flank pain, and leg swelling.  Your leg swelling sounds chronic and does not pose an acute emergency today.  Please use compression stockings and follow-up with your primary care provider about this issue.  Your left flank pain sounds musculoskeletal in nature.  If your pain does not improve or worsens please follow-up with your primary care provider or return to the emergency department as needed.  Get help right away if: Your chest pain gets worse. You have a cough that gets worse, or you cough up blood. You have severe pain in your abdomen. You faint. You have sudden, unexplained chest discomfort. You have sudden, unexplained discomfort in your arms, back, neck, or jaw. You have shortness of breath at any time. You suddenly start to sweat, or your skin gets clammy. You feel nausea or you vomit. You suddenly feel lightheaded or dizzy. You have severe weakness, or unexplained weakness or fatigue. Your heart begins to beat quickly, or it feels like it is skipping beats.  Based on your lab work and EKG your chest pain does not appear to be due to a acute heart attack today.  Follow-up with your primary care doctor for repeat evaluation for this issue.  During your hospital stay today your blood pressure was found to be elevated.  Please follow-up with your primary care provider about this issue.

## 2020-12-18 NOTE — ED Triage Notes (Addendum)
Pt arrived via walk in, c/o flank pain and continuous mid sternal chest pain. Worsening when lying flat as well as bilateral leg swelling that has been worsening recently but ongoing for years. Denies any urinary sx.

## 2020-12-29 ENCOUNTER — Other Ambulatory Visit: Payer: Self-pay

## 2020-12-29 ENCOUNTER — Encounter (HOSPITAL_COMMUNITY): Payer: Self-pay | Admitting: Emergency Medicine

## 2020-12-29 ENCOUNTER — Emergency Department (HOSPITAL_COMMUNITY)
Admission: EM | Admit: 2020-12-29 | Discharge: 2020-12-29 | Disposition: A | Discharge status: Home / Self Care | Payer: BC Managed Care – PPO | Attending: Emergency Medicine | Admitting: Emergency Medicine

## 2020-12-29 DIAGNOSIS — Z3202 Encounter for pregnancy test, result negative: Secondary | ICD-10-CM | POA: Diagnosis not present

## 2020-12-29 DIAGNOSIS — R6883 Chills (without fever): Secondary | ICD-10-CM | POA: Diagnosis not present

## 2020-12-29 DIAGNOSIS — R112 Nausea with vomiting, unspecified: Secondary | ICD-10-CM | POA: Insufficient documentation

## 2020-12-29 DIAGNOSIS — Z7982 Long term (current) use of aspirin: Secondary | ICD-10-CM | POA: Insufficient documentation

## 2020-12-29 LAB — URINALYSIS, ROUTINE W REFLEX MICROSCOPIC
Bilirubin Urine: NEGATIVE
Glucose, UA: NEGATIVE mg/dL
Hgb urine dipstick: NEGATIVE
Ketones, ur: NEGATIVE mg/dL
Nitrite: NEGATIVE
Protein, ur: NEGATIVE mg/dL
Specific Gravity, Urine: 1.01 (ref 1.005–1.030)
pH: 6 (ref 5.0–8.0)

## 2020-12-29 LAB — PREGNANCY, URINE: Preg Test, Ur: NEGATIVE

## 2020-12-29 MED ORDER — ONDANSETRON 4 MG PO TBDP: 4.0000 mg | ORAL_TABLET | Freq: Three times a day (TID) | ORAL | 0 refills | Status: AC | PRN

## 2020-12-29 MED ORDER — ONDANSETRON 4 MG PO TBDP
4.0000 mg | ORAL_TABLET | Freq: Once | ORAL | Status: AC
  Administered 2020-12-29: 4 mg via ORAL
  Filled 2020-12-29: qty 1

## 2020-12-29 NOTE — ED Notes (Signed)
Patient tolerating PO fluids 

## 2020-12-29 NOTE — ED Notes (Signed)
Patient provided with water for PO challenge.

## 2020-12-29 NOTE — ED Provider Notes (Signed)
[facility] DEPT Provider Note   CSN: [id number] Arrival date & time: 12/29/20  1835     History Chief Complaint  Patient presents with  . Emesis    Misty Oneill is a 25 y.o. female.  Patient presents to ER chief complaint of nausea vomiting and chills.  Symptoms started yesterday after she ate out at a restaurant and had pizza and pasta with her significant other.  Her other family member is having diarrhea while the patient is having multiple episodes of nonbloody nonbilious vomiting.  She otherwise denies headache denies chest pain denies abdominal pain at this time.  No fever no cough.        Past Medical History:  Diagnosis Date  . PCOS (polycystic ovarian syndrome)     Patient Active Problem List   Diagnosis Date Noted  . Decreased libido 11/06/2020  . Polycystic ovarian syndrome 10/08/2020  . Elevated blood pressure affecting pregnancy in third trimester, antepartum 12/09/2018  . Right lower quadrant abdominal pain 04/18/2017    Past Surgical History:  Procedure Laterality Date  . CESAREAN SECTION    . LAPAROSCOPIC APPENDECTOMY N/A 04/19/2017   Procedure: APPENDECTOMY LAPAROSCOPIC;  Surgeon: Jackolyn Confer, MD;  Location: [facility];  Service: General;  Laterality: N/A;  . WISDOM TOOTH EXTRACTION       OB History    Gravida  1   Para  1   Term  1   Preterm      AB      Living  1     SAB      IAB      Ectopic      Multiple      Live Births  1           Family History  Problem Relation Age of Onset  . Diabetes Mother   . Heart disease Mother   . Polycystic ovary syndrome Mother   . Healthy Father   . Arthritis Maternal Grandmother   . Healthy Paternal Grandmother   . Healthy Paternal Grandfather     Social History   Tobacco Use  . Smoking status: Never Smoker  . Smokeless tobacco: Never Used  Vaping Use  . Vaping Use: Never used  Substance Use Topics  . Alcohol use: Yes     Alcohol/week: 2.0 standard drinks    Types: 1 Glasses of wine, 1 Shots of liquor per week    Comment: social  . Drug use: No    Home Medications Prior to Admission medications   Medication Sig Start Date End Date Taking? Authorizing Provider  ondansetron (ZOFRAN-ODT) 4 MG disintegrating tablet Take 1 tablet (4 mg total) by mouth every 8 (eight) hours as needed for nausea or vomiting. 12/29/20  Yes Luna Fuse, MD  aspirin-acetaminophen-caffeine (EXCEDRIN MIGRAINE) [phone] MG tablet Take 2 tablets by mouth every 6 (six) hours as needed for headache.    [provider]  ibuprofen (ADVIL) 200 MG tablet Take 200 mg by mouth every 6 (six) hours as needed for fever, headache or mild pain.    [provider]    Allergies    Patient has no known allergies.  Review of Systems   Review of Systems  Constitutional: Negative for fever.  HENT: Negative for ear pain.   Eyes: Negative for pain.  Respiratory: Negative for cough.   Cardiovascular: Negative for chest pain.  Gastrointestinal: Positive for vomiting. Negative for abdominal pain.  Genitourinary: Negative for flank pain.  Musculoskeletal:  Negative for back pain.  Skin: Negative for rash.  Neurological: Negative for headaches.    Physical Exam Updated Vital Signs BP 126/68   Pulse 100   Temp 98.8 F (37.1 C) (Oral)   Resp 18   LMP 12/29/2020   SpO2 100%   Physical Exam Constitutional:      General: She is not in acute distress.    Appearance: Normal appearance.  HENT:     Head: Normocephalic.     Nose: Nose normal.  Eyes:     Extraocular Movements: Extraocular movements intact.  Cardiovascular:     Rate and Rhythm: Normal rate.  Pulmonary:     Effort: Pulmonary effort is normal.  Musculoskeletal:        General: Normal range of motion.     Cervical back: Normal range of motion.  Neurological:     General: No focal deficit present.     Mental Status: She is alert and oriented to person, place,  and time. Mental status is at baseline.     Cranial Nerves: No cranial nerve deficit.     Motor: No weakness.     Gait: Gait normal.     ED Results / Procedures / Treatments   Labs (all labs ordered are listed, but only abnormal results are displayed) Labs Reviewed  URINALYSIS, ROUTINE W REFLEX MICROSCOPIC - Abnormal; Notable for the following components:      Result Value   Leukocytes,Ua SMALL (*)    All other components within normal limits  PREGNANCY, URINE    EKG None  Radiology No results found.  Procedures Procedures   Medications Ordered in ED Medications  ondansetron (ZOFRAN-ODT) disintegrating tablet 4 mg (4 mg Oral Given 12/29/20 1903)    ED Course  I have reviewed the triage vital signs and the nursing notes.  Pertinent labs & imaging results that were available during my care of the patient were reviewed by me and considered in my medical decision making (see chart for details).    MDM Rules/Calculators/A&P                          Patient did mention that she was having some chills.  Urinalysis sent this is unremarkable.  Pregnancy test is negative given new onset of vomiting.  Patient given Zofran subsequently able to tolerate p.o. challenge.  Will be discharged home with a prescription of Zofran.  Advised immediate return for worsening symptoms fevers pain or any additional concerns.  Final Clinical Impression(s) / ED Diagnoses Final diagnoses:  Non-intractable vomiting with nausea, unspecified vomiting type    Rx / DC Orders ED Discharge Orders         Ordered    ondansetron (ZOFRAN-ODT) 4 MG disintegrating tablet  Every 8 hours PRN        12/29/20 1959           Luna Fuse, MD 12/29/20 [phone]

## 2020-12-29 NOTE — Discharge Instructions (Addendum)
Call your primary care doctor or specialist as discussed in the next 2-3 days.   Return immediately back to the ER if:  Your symptoms worsen within the next 12-24 hours. You develop new symptoms such as new fevers, persistent vomiting, new pain, shortness of breath, or new weakness or numbness, or if you have any other concerns.  

## 2020-12-29 NOTE — ED Triage Notes (Signed)
Patient c/o N/V since 0100 today with chills.

## 2021-03-14 ENCOUNTER — Ambulatory Visit (INDEPENDENT_AMBULATORY_CARE_PROVIDER_SITE_OTHER): Payer: BC Managed Care – PPO | Admitting: Family Medicine

## 2021-03-14 ENCOUNTER — Encounter: Payer: Self-pay | Admitting: Family Medicine

## 2021-03-14 ENCOUNTER — Other Ambulatory Visit: Payer: Self-pay

## 2021-03-14 VITALS — BP 118/72 | HR 76 | Temp 97.4°F | Ht 67.0 in | Wt 235.0 lb

## 2021-03-14 DIAGNOSIS — M7989 Other specified soft tissue disorders: Secondary | ICD-10-CM | POA: Diagnosis not present

## 2021-03-14 DIAGNOSIS — E669 Obesity, unspecified: Secondary | ICD-10-CM | POA: Diagnosis not present

## 2021-03-14 NOTE — Progress Notes (Signed)
[facility] [address] [place] [address] Dept: [phone] Dept Fax: [phone]  Office Visit  Subjective:    Patient ID: Misty Oneill, female    DOB: Aug 10, 1996, 25 y.o..   MRN: [id number]  Chief Complaint  Patient presents with   Follow-up    Discuss weight loss.     History of Present Illness:  Patient is in today for a discussion about medication for weight loss. Misty Oneill notes she has struggled to lose weight over the past 2 years. She weighed about 190 lbs. prior to her pregnancy. She gained about 20 lbs. and then lost it afterwards. Since then, she has had ~ 145 lb. weight gain. She has tried various dietary approaches and has engaged in regular exercise. she notes she can achieve about a 10 lb. weight loss and then the weight returns. She notes her sister is managed on phentermine and has been able to have some weight loss, plus feels it gives her more energy. She had discussed weight loss with her GYN, who prescribed Ozempic. However, the co-pay was $900, so she did not have this filled. Her GYN was not comfortable prescribing phentermine, so referred her to her PCP.  Misty Oneill also notes she continues to have issues with regular leg swelling. She notes this involves the right leg more than the left. It does typically go down over night. The swelling started during her pregnancy 2 years ago. During the pregnancy, she used pressure stockings, but doesn't now.  Past Medical History: Patient Active Problem List   Diagnosis Date Noted   Obesity (BMI 35.0-39.9 without comorbidity) 03/14/2021   Decreased libido 11/06/2020   Polycystic ovarian syndrome 10/08/2020   Elevated blood pressure affecting pregnancy in third trimester, antepartum 12/09/2018   Right lower quadrant abdominal pain 04/18/2017   Past Surgical History:  Procedure Laterality Date   CESAREAN SECTION     LAPAROSCOPIC APPENDECTOMY  N/A 04/19/2017   Procedure: APPENDECTOMY LAPAROSCOPIC;  Surgeon: Jackolyn Confer, MD;  Location: [facility];  Service: General;  Laterality: N/A;   WISDOM TOOTH EXTRACTION     Family History  Problem Relation Age of Onset   Diabetes Mother    Heart disease Mother    Polycystic ovary syndrome Mother    Healthy Father    Arthritis Maternal Grandmother    Healthy Paternal Grandmother    Healthy Paternal Grandfather     Outpatient Medications Prior to Visit  Medication Sig Dispense Refill   aspirin-acetaminophen-caffeine (EXCEDRIN MIGRAINE) [phone] MG tablet Take 2 tablets by mouth every 6 (six) hours as needed for headache.     ESTARYLLA 0.25-35 MG-MCG tablet Take 1 tablet by mouth daily.     ibuprofen (ADVIL) 200 MG tablet Take 200 mg by mouth every 6 (six) hours as needed for fever, headache or mild pain.     ondansetron (ZOFRAN-ODT) 4 MG disintegrating tablet Take 1 tablet (4 mg total) by mouth every 8 (eight) hours as needed for nausea or vomiting. 20 tablet 0   No facility-administered medications prior to visit.   No Known Allergies    Objective:   Today's Vitals   03/14/21 0807  BP: 118/72  Pulse: 76  Temp: (!) 97.4 F (36.3 C)  TempSrc: Temporal  SpO2: 98%  Weight: 235 lb (106.6 kg)  Height: '5\' 7"'$  (1.702 m)   Body mass index is 36.81 kg/m.   General: Well developed, well nourished. No acute distress. Extremities: No edema noted. Psych: Alert  and oriented. Normal mood and affect.  Health Maintenance Due  Topic Date Due   COVID-19 Vaccine (1) Never done   HPV VACCINES (1 - 2-dose series) Never done   Hepatitis C Screening  Never done   Lab Results Lab Results  Component Value Date   HGBA1C 5.5 10/08/2020   Lab Results  Component Value Date   CHOL 183 10/08/2020   HDL 53.90 10/08/2020   LDLCALC 113 (H) 10/08/2020   TRIG 80.0 10/08/2020   CHOLHDL 3 10/08/2020     Assessment & Plan:   1. Obesity (BMI 35.0-39.9 without comorbidity) Discussed  approaches to sustainable weight loss. I am not comfortable prescribing phentermine without the patient engaged in a structured diet and exercise program. We also discussed there are other medication options to assist with weight loss. I recommend referral to the [facility] medical weight management program.  - Amb Ref to Medical Weight Management  2. Leg swelling I pointed out that swelling will likely be worse during the hot, humid summer months for those prone to this. Because of her continued concern, I will refer Misty Oneill for a more complete vein evaluaiton and to discuss potential options.  - Ambulatory referral to Vascular Surgery  Haydee Salter, MD

## 2021-04-08 ENCOUNTER — Encounter: Payer: Self-pay | Admitting: *Deleted

## 2021-12-13 IMAGING — DX DG CHEST 1V PORT
1 series · 1 of 1 positions shown · non-contrast
Comparison: CT 04/18/2017

CLINICAL DATA: Chest pain flank pain.

EXAM:
PORTABLE CHEST 1 VIEW

[chest ap]
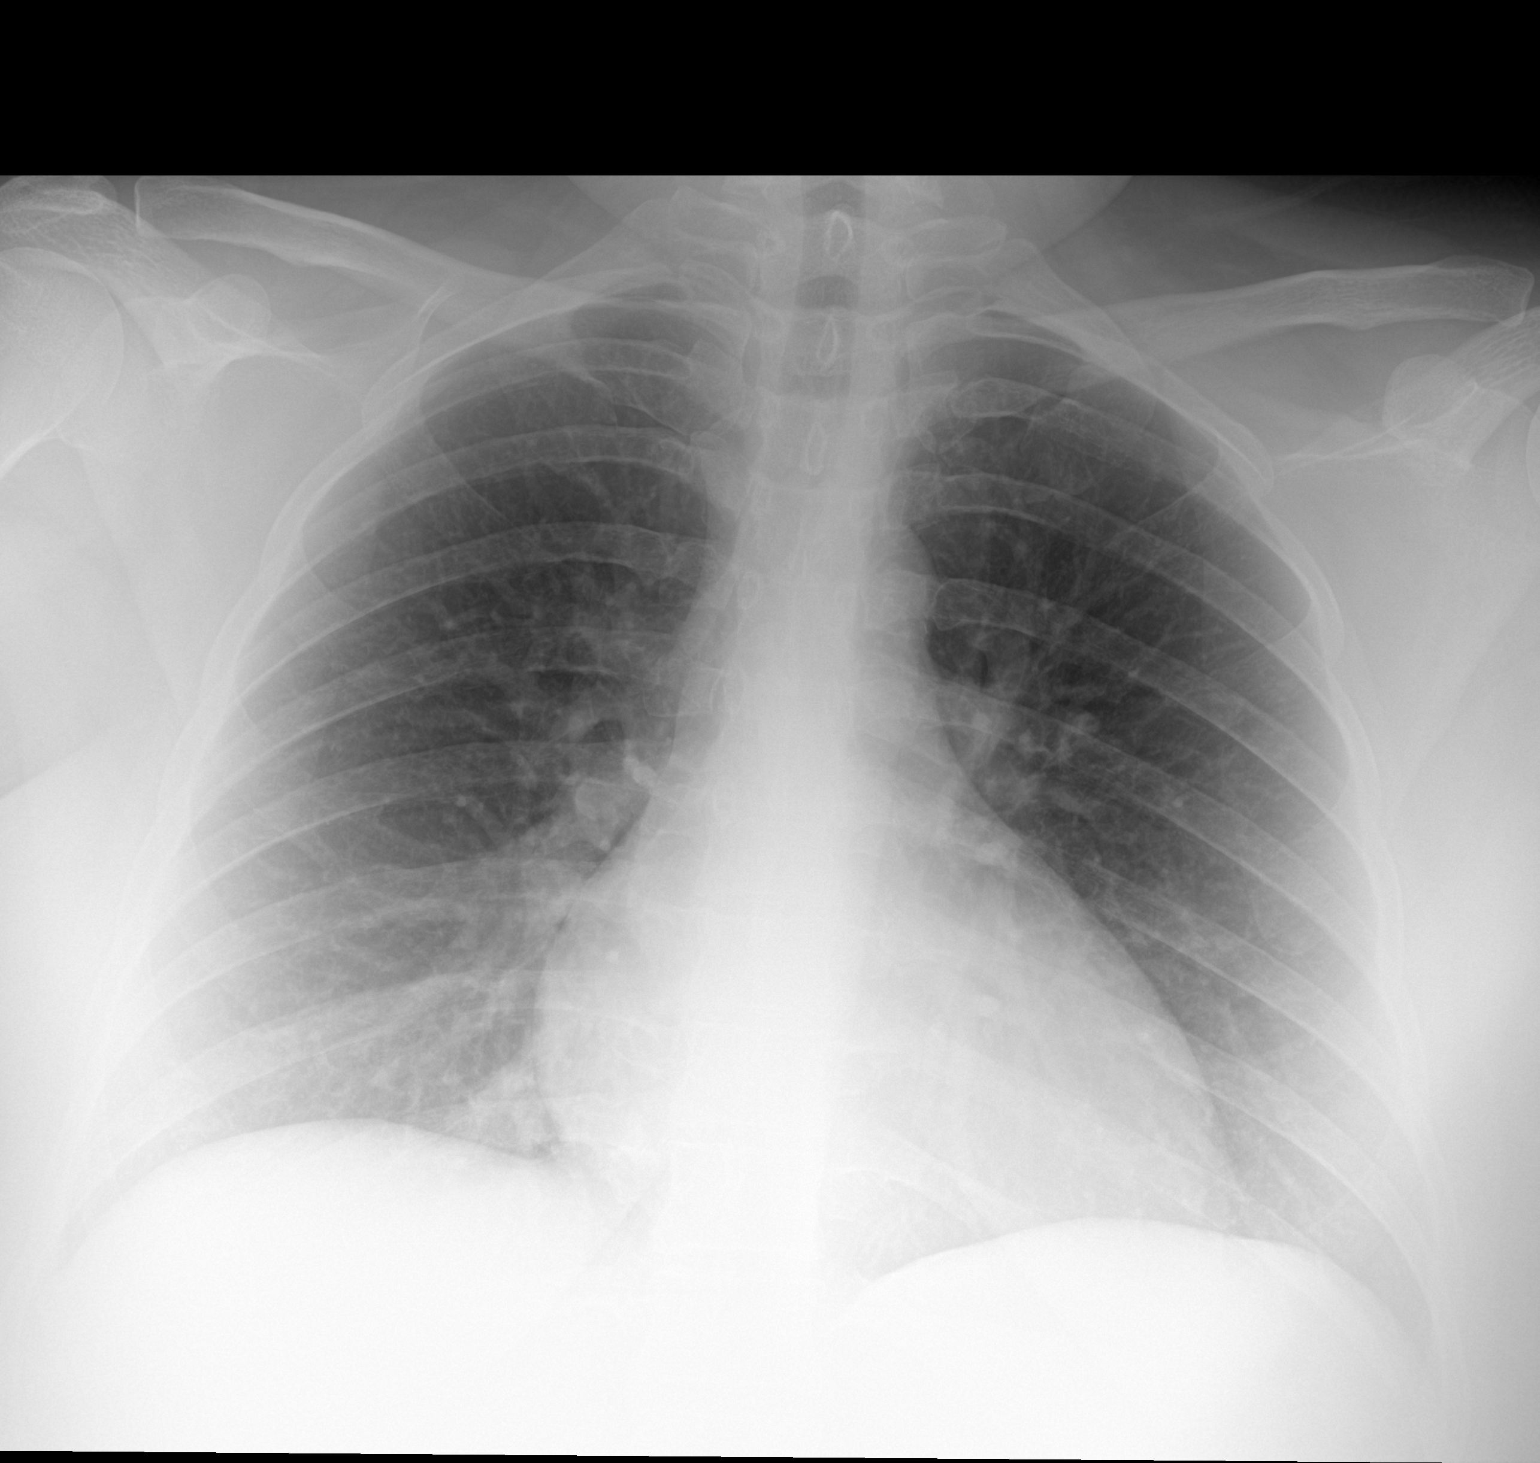

[1 of 1 positions shown; findings below may reference images not displayed]

FINDINGS: The heart size and mediastinal contours are within normal limits.
Both lungs are clear. The visualized skeletal structures are
unremarkable.
IMPRESSION: No active disease.
